# Patient Record
Sex: Female | Born: 1985 | Hispanic: Yes | Marital: Married | Smoking: Never smoker
Health system: Southern US, Community
[De-identification: ages and names within clinical notes are randomized; demographics above are authoritative.]

## PROBLEM LIST (undated history)

## (undated) DIAGNOSIS — E079 Disorder of thyroid, unspecified: Secondary | ICD-10-CM

## (undated) HISTORY — DX: Disorder of thyroid, unspecified: E07.9

---

## 2015-04-17 DIAGNOSIS — Z139 Encounter for screening, unspecified: Secondary | ICD-10-CM

## 2015-04-21 ENCOUNTER — Other Ambulatory Visit: Payer: Self-pay | Admitting: Physician Assistant

## 2015-04-21 ENCOUNTER — Ambulatory Visit: Payer: Self-pay | Admitting: Physician Assistant

## 2015-04-21 ENCOUNTER — Encounter: Payer: Self-pay | Admitting: Physician Assistant

## 2015-04-21 VITALS — BP 136/86 | HR 81 | Temp 97.7°F | Ht 59.5 in | Wt 158.8 lb

## 2015-04-21 DIAGNOSIS — J329 Chronic sinusitis, unspecified: Secondary | ICD-10-CM

## 2015-04-21 DIAGNOSIS — R51 Headache: Secondary | ICD-10-CM

## 2015-04-21 DIAGNOSIS — Z131 Encounter for screening for diabetes mellitus: Secondary | ICD-10-CM

## 2015-04-21 DIAGNOSIS — R519 Headache, unspecified: Secondary | ICD-10-CM

## 2015-04-21 LAB — COMPLETE METABOLIC PANEL WITH GFR
ALBUMIN: 4.4 g/dL (ref 3.6–5.1)
ALT: 151 U/L — ABNORMAL HIGH (ref 6–29)
AST: 60 U/L — AB (ref 10–30)
Alkaline Phosphatase: 115 U/L (ref 33–115)
BILIRUBIN TOTAL: 0.5 mg/dL (ref 0.2–1.2)
BUN: 8 mg/dL (ref 7–25)
CO2: 23 mmol/L (ref 20–31)
Calcium: 9.2 mg/dL (ref 8.6–10.2)
Chloride: 106 mmol/L (ref 98–110)
Creat: 0.53 mg/dL (ref 0.50–1.10)
GFR, Est African American: >89 mL/min (ref >=60)
GLUCOSE: 73 mg/dL (ref 65–99)
Potassium: 4.2 mmol/L (ref 3.5–5.3)
SODIUM: 139 mmol/L (ref 135–146)
TOTAL PROTEIN: 7.6 g/dL (ref 6.1–8.1)

## 2015-04-21 LAB — CBC WITH DIFFERENTIAL/PLATELET
BASOS ABS: 0 10*3/uL (ref 0.0–0.1)
BASOS PCT: 0 % (ref 0–1)
EOS ABS: 0.1 10*3/uL (ref 0.0–0.7)
Eosinophils Relative: 1 % (ref 0–5)
HCT: 37.9 % (ref 36.0–46.0)
HEMOGLOBIN: 12.5 g/dL (ref 12.0–15.0)
Lymphocytes Relative: 32 % (ref 12–46)
Lymphs Abs: 2.7 10*3/uL (ref 0.7–4.0)
MCH: 29.8 pg (ref 26.0–34.0)
MCHC: 33 g/dL (ref 30.0–36.0)
MCV: 90.2 fL (ref 78.0–100.0)
MONOS PCT: 7 % (ref 3–12)
MPV: 8.8 fL (ref 8.6–12.4)
Monocytes Absolute: 0.6 10*3/uL (ref 0.1–1.0)
NEUTROS ABS: 5 10*3/uL (ref 1.7–7.7)
NEUTROS PCT: 60 % (ref 43–77)
PLATELETS: 328 10*3/uL (ref 150–400)
RBC: 4.2 MIL/uL (ref 3.87–5.11)
RDW: 13.8 % (ref 11.5–15.5)
WBC: 8.3 10*3/uL (ref 4.0–10.5)

## 2015-04-21 LAB — TSH: TSH: 5.08 mIU/L — ABNORMAL HIGH

## 2015-04-21 LAB — GLUCOSE, POCT (MANUAL RESULT ENTRY): POC GLUCOSE: 87 mg/dL (ref 70–99)

## 2015-04-21 MED ORDER — AMOXICILLIN 500 MG PO CAPS: 500.0000 mg | ORAL_CAPSULE | Freq: Three times a day (TID) | ORAL | Status: DC

## 2015-04-21 NOTE — Progress Notes (Signed)
BP 136/86 mmHg  Pulse 81  Temp(Src) 97.7 F (36.5 C)  Ht 4' 11.5" (1.511 m)  Wt 158 lb 12.8 oz (72.031 kg)  BMI 31.55 kg/m2  SpO2 99%   Subjective:    Patient ID: Brandi Wilson, female    DOB: Mar 20, 1985, 30 y.o.   MRN: [id number]  HPI: Brandi Wilson is a 30 y.o. female presenting on 04/21/2015 for New Patient (Initial Visit); Headache; and Nasal Congestion   HPI  Chief Complaint  Patient presents with  . New Patient (Initial Visit)  . Headache    R sided HA started 1 year ago  . Nasal Congestion    with R sided Ear pain, began 1 month ago   Pt takes 1 tylenol each day for her HA which  Calms it down but doesn't make it go away She says the pain is more often on the R but sometimes it is on the L.  Radiates to the eye .  When her head hurts, her ear also hurts.   She says she never has a time when she isn't hurting   Pt has been on depo x 5 years.  Relevant past medical, surgical, family and social history reviewed and updated as indicated. Interim medical history since our last visit reviewed. Allergies and medications reviewed and updated.   Current outpatient prescriptions:  .  Acetaminophen (TYLENOL PO), Take 1 tablet by mouth daily., Disp: , Rfl:  .  medroxyPROGESTERone (DEPO-PROVERA) 150 MG/ML injection, Inject 150 mg into the muscle every 3 (three) months., Disp: , Rfl:      Review of Systems  Constitutional: Positive for chills. Negative for fever, diaphoresis, appetite change, fatigue and unexpected weight change.  HENT: Positive for ear pain and sneezing. Negative for congestion, dental problem, drooling, facial swelling, hearing loss, mouth sores, sore throat, trouble swallowing and voice change.   Eyes: Negative for pain, discharge, redness, itching and visual disturbance.  Respiratory: Negative for cough, choking, shortness of breath and wheezing.   Cardiovascular: Negative for chest pain, palpitations and leg swelling.   Gastrointestinal: Positive for abdominal pain and constipation. Negative for vomiting, diarrhea and blood in stool.  Endocrine: Positive for polydipsia. Negative for cold intolerance and heat intolerance.  Genitourinary: Negative for dysuria, hematuria and decreased urine volume.  Musculoskeletal: Negative for back pain, arthralgias and gait problem.  Skin: Negative for rash.  Allergic/Immunologic: Negative for environmental allergies.  Neurological: Positive for light-headedness and headaches. Negative for seizures and syncope.  Hematological: Negative for adenopathy.  Psychiatric/Behavioral: Negative for suicidal ideas, dysphoric mood and agitation. The patient is not nervous/anxious.     Per HPI unless specifically indicated above     Objective:    BP 136/86 mmHg  Pulse 81  Temp(Src) 97.7 F (36.5 C)  Ht 4' 11.5" (1.511 m)  Wt 158 lb 12.8 oz (72.031 kg)  BMI 31.55 kg/m2  SpO2 99%  Wt Readings from Last 3 Encounters:  04/21/15 158 lb 12.8 oz (72.031 kg)    Physical Exam  Constitutional: She is oriented to person, place, and time. She appears well-developed and well-nourished.  HENT:  Head: Normocephalic and atraumatic.  Right Ear: Hearing, tympanic membrane, external ear and ear canal normal.  Left Ear: Hearing, tympanic membrane, external ear and ear canal normal.  Nose: Mucosal edema and rhinorrhea present. No sinus tenderness. Right sinus exhibits frontal sinus tenderness. Right sinus exhibits no maxillary sinus tenderness. Left sinus exhibits no maxillary sinus tenderness and no  frontal sinus tenderness.  Mouth/Throat: Uvula is midline and oropharynx is clear and moist. No oropharyngeal exudate.  Eyes: Conjunctivae and EOM are normal. Pupils are equal, round, and reactive to light.  Neck: Neck supple. No thyromegaly present.  Cardiovascular: Normal rate and regular rhythm.   Pulmonary/Chest: Effort normal and breath sounds normal. She has no wheezes.  Abdominal: Soft.  Bowel sounds are normal. She exhibits no mass. There is no hepatosplenomegaly. There is no tenderness.  Musculoskeletal: She exhibits no edema.  Lymphadenopathy:    She has no cervical adenopathy.  Neurological: She is alert and oriented to person, place, and time. Gait normal.  Skin: Skin is warm and dry.  Psychiatric: She has a normal mood and affect. Her behavior is normal.  Vitals reviewed.   Results for orders placed or performed in visit on 04/21/15  POCT Glucose (CBG)  Result Value Ref Range   POC Glucose 87 70 - 99 mg/dl      Assessment & Plan:    Encounter Diagnoses  Name Primary?  . Sinusitis, unspecified chronicity, unspecified location Yes  . Screening for diabetes mellitus   . Nonintractable headache, unspecified chronicity pattern, unspecified headache type    -Check labs today.  Rx amoxil.  Counseled pt to finish abx until it is gone. -f/u OV 1 mo.  RTO sooner prn new symptoms or worsening

## 2015-04-22 ENCOUNTER — Ambulatory Visit: Payer: Self-pay | Admitting: Physician Assistant

## 2015-04-23 LAB — HEPATITIS PANEL, ACUTE
HCV Ab: NEGATIVE
Hep A IgM: NONREACTIVE
Hep B C IgM: NONREACTIVE
Hepatitis B Surface Ag: NEGATIVE

## 2015-05-22 ENCOUNTER — Ambulatory Visit: Payer: Self-pay | Admitting: Physician Assistant

## 2015-05-22 ENCOUNTER — Encounter: Payer: Self-pay | Admitting: Physician Assistant

## 2015-05-22 VITALS — BP 100/70 | HR 68 | Temp 97.9°F | Ht 59.5 in | Wt 158.0 lb

## 2015-05-22 DIAGNOSIS — E669 Obesity, unspecified: Secondary | ICD-10-CM

## 2015-05-22 DIAGNOSIS — R748 Abnormal levels of other serum enzymes: Secondary | ICD-10-CM | POA: Insufficient documentation

## 2015-05-22 DIAGNOSIS — G8929 Other chronic pain: Secondary | ICD-10-CM

## 2015-05-22 DIAGNOSIS — R51 Headache: Principal | ICD-10-CM

## 2015-05-22 DIAGNOSIS — R519 Headache, unspecified: Secondary | ICD-10-CM | POA: Insufficient documentation

## 2015-05-22 NOTE — Progress Notes (Signed)
BP 100/70 mmHg  Pulse 68  Temp(Src) 97.9 F (36.6 C)  Ht 4' 11.5" (1.511 m)  Wt 158 lb (71.668 kg)  BMI 31.39 kg/m2  SpO2 97%   Subjective:    Patient ID: Brandi Wilson, female    DOB: 1985-05-10, 30 y.o.   MRN: [id number]  HPI: Brandi Wilson is a 30 y.o. female presenting on 05/22/2015 for Headache   HPI Pt says she is taking one tylenol daily.  She says she never takes more than 2/ day.   Pt states she finished her amoxil antibiotic last week.  She says she doesn't feel any different.  HA hurts every day, all day long - R parietal region.  HA started 1 year ago.  She never went to hospital for the HA.  Rates pain 10/10.     Relevant past medical, surgical, family and social history reviewed and updated as indicated. Interim medical history since our last visit reviewed. Allergies and medications reviewed and updated.  Current outpatient prescriptions:  .  Acetaminophen (TYLENOL PO), Take 1 tablet by mouth daily., Disp: , Rfl:  .  medroxyPROGESTERone (DEPO-PROVERA) 150 MG/ML injection, Inject 150 mg into the muscle every 3 (three) months., Disp: , Rfl:   Review of Systems  Constitutional: Positive for chills. Negative for fever, diaphoresis, appetite change, fatigue and unexpected weight change.  HENT: Positive for sneezing. Negative for congestion, dental problem, drooling, ear pain, facial swelling, hearing loss, mouth sores, sore throat, trouble swallowing and voice change.   Eyes: Positive for itching. Negative for pain, discharge, redness and visual disturbance.  Respiratory: Positive for cough. Negative for choking, shortness of breath and wheezing.   Cardiovascular: Negative for chest pain, palpitations and leg swelling.  Gastrointestinal: Negative for vomiting, abdominal pain, diarrhea, constipation and blood in stool.  Endocrine: Positive for polydipsia. Negative for cold intolerance and heat intolerance.  Genitourinary: Negative for  dysuria, hematuria and decreased urine volume.  Musculoskeletal: Positive for back pain and gait problem. Negative for arthralgias.  Skin: Negative for rash.  Allergic/Immunologic: Negative for environmental allergies.  Neurological: Negative for seizures, syncope, light-headedness and headaches.  Hematological: Negative for adenopathy.  Psychiatric/Behavioral: Negative for suicidal ideas, dysphoric mood and agitation. The patient is not nervous/anxious.     Per HPI unless specifically indicated above     Objective:    BP 100/70 mmHg  Pulse 68  Temp(Src) 97.9 F (36.6 C)  Ht 4' 11.5" (1.511 m)  Wt 158 lb (71.668 kg)  BMI 31.39 kg/m2  SpO2 97%  Wt Readings from Last 3 Encounters:  05/22/15 158 lb (71.668 kg)  04/21/15 158 lb 12.8 oz (72.031 kg)    Physical Exam  Constitutional: She is oriented to person, place, and time. She appears well-developed and well-nourished.  HENT:  Head: Normocephalic and atraumatic.  Neck: Neck supple.  Cardiovascular: Normal rate and regular rhythm.   Pulmonary/Chest: Effort normal and breath sounds normal.  Abdominal: Soft. Bowel sounds are normal. She exhibits no mass. There is no hepatosplenomegaly. There is no tenderness.  Musculoskeletal: She exhibits no edema.  Lymphadenopathy:    She has no cervical adenopathy.  Neurological: She is alert and oriented to person, place, and time. She has normal strength. No cranial nerve deficit or sensory deficit. Coordination and gait normal.  Skin: Skin is warm and dry.  Psychiatric: She has a normal mood and affect. Her behavior is normal.  Vitals reviewed.   Results for orders placed or performed in  visit on 04/21/15  CBC w/Diff/Platelet  Result Value Ref Range   WBC 8.3 4.0 - 10.5 K/uL   RBC 4.20 3.87 - 5.11 MIL/uL   Hemoglobin 12.5 12.0 - 15.0 g/dL   HCT 37.9 36.0 - 46.0 %   MCV 90.2 78.0 - 100.0 fL   MCH 29.8 26.0 - 34.0 pg   MCHC 33.0 30.0 - 36.0 g/dL   RDW 13.8 11.5 - 15.5 %   Platelets  328 150 - 400 K/uL   MPV 8.8 8.6 - 12.4 fL   Neutrophils Relative % 60 43 - 77 %   Neutro Abs 5.0 1.7 - 7.7 K/uL   Lymphocytes Relative 32 12 - 46 %   Lymphs Abs 2.7 0.7 - 4.0 K/uL   Monocytes Relative 7 3 - 12 %   Monocytes Absolute 0.6 0.1 - 1.0 K/uL   Eosinophils Relative 1 0 - 5 %   Eosinophils Absolute 0.1 0.0 - 0.7 K/uL   Basophils Relative 0 0 - 1 %   Basophils Absolute 0.0 0.0 - 0.1 K/uL   Smear Review Criteria for review not met   COMPLETE METABOLIC PANEL WITH GFR  Result Value Ref Range   Sodium 139 135 - 146 mmol/L   Potassium 4.2 3.5 - 5.3 mmol/L   Chloride 106 98 - 110 mmol/L   CO2 23 20 - 31 mmol/L   Glucose, Bld 73 65 - 99 mg/dL   BUN 8 7 - 25 mg/dL   Creat 0.53 0.50 - 1.10 mg/dL   Total Bilirubin 0.5 0.2 - 1.2 mg/dL   Alkaline Phosphatase 115 33 - 115 U/L   AST 60 (H) 10 - 30 U/L   ALT 151 (H) 6 - 29 U/L   Total Protein 7.6 6.1 - 8.1 g/dL   Albumin 4.4 3.6 - 5.1 g/dL   Calcium 9.2 8.6 - 10.2 mg/dL   GFR, Est African American >89 >=60 mL/min   GFR, Est Non African American >89 >=60 mL/min  TSH  Result Value Ref Range   TSH 5.08 (H) mIU/L  POCT Glucose (CBG)  Result Value Ref Range   POC Glucose 87 70 - 99 mg/dl      Assessment & Plan:   Encounter Diagnoses  Name Primary?  . Chronic nonintractable headache, unspecified headache type Yes  . Elevated liver enzymes   . Obesity, unspecified     -Reviewed labs with pt -counseled pt to Stop tylenol- take ibu prn -in light of history, will Scan head- [facility] discount application given -f/u 1 month

## 2015-05-26 DIAGNOSIS — E669 Obesity, unspecified: Secondary | ICD-10-CM | POA: Insufficient documentation

## 2015-06-04 ENCOUNTER — Ambulatory Visit (HOSPITAL_COMMUNITY)
Admission: RE | Admit: 2015-06-04 | Discharge: 2015-06-04 | Disposition: A | Discharge status: Home / Self Care | Payer: Self-pay | Source: Ambulatory Visit | Attending: Physician Assistant | Admitting: Physician Assistant

## 2015-06-04 DIAGNOSIS — G930 Cerebral cysts: Secondary | ICD-10-CM | POA: Insufficient documentation

## 2015-06-04 DIAGNOSIS — R51 Headache: Secondary | ICD-10-CM | POA: Insufficient documentation

## 2015-06-23 ENCOUNTER — Ambulatory Visit: Payer: Self-pay | Admitting: Physician Assistant

## 2015-06-30 ENCOUNTER — Ambulatory Visit: Payer: Self-pay | Admitting: Physician Assistant

## 2015-07-02 ENCOUNTER — Encounter: Payer: Self-pay | Admitting: Physician Assistant

## 2015-07-18 NOTE — Congregational Nurse Program (Signed)
Congregational Nurse Program Note  Date of Encounter: 04/17/2015  Past Medical History: No past medical history on file.  Encounter Details:    [facility] Client states she was [facility], but she does not wish to go there any longer due to transportation and scheduling issues per client. She does go to the [facility] for family planning and is due her next injection of Depo on April 17th, 2017. Client is aware that she will need to continue there for her injections. Chief complaints today are Headaches, with history of migraines states they are right-sided and it affects her vision and she reports dizziness. She states she has headaches daily. She denies sinus congestion or recent illness. She has complaint of acid reflux as well as hip pain since she fell in [place] 4 to 5 years ago. Also complains of excessive thirst and excessive hunger along with constipation. Client reports she only drinks 1 glass of water a day ONLY, nothing else and that she eats virtually no fruits or vegetables. Instructed client on the importance of staying hydrated and that drinking water at least 6 to 8 glasses a day is the best way and that will possibly help with constipation. Instructed client that symptoms of dehydration can be headaches and she states understanding . We also discussed the importance of fresh fruits and vegetables in maintaining a healthy diet , but also fiber that can aid in constipation symptoms. Client states understanding. [facility] and appointment made for 04/21/15 at 0945 am. Stressed the importance of continued follow up care even when she feels better as a way to maintain health and to receive preventative care and screenings. Client states understanding. Will follow up as needed. Interview assisted by interpreter Kathaleen BuryMilena Alvarez.

## 2015-07-30 ENCOUNTER — Other Ambulatory Visit (HOSPITAL_COMMUNITY): Payer: Self-pay | Admitting: Nurse Practitioner

## 2015-07-30 DIAGNOSIS — N644 Mastodynia: Secondary | ICD-10-CM

## 2015-08-04 ENCOUNTER — Ambulatory Visit: Payer: Self-pay | Admitting: Physician Assistant

## 2015-08-04 ENCOUNTER — Encounter: Payer: Self-pay | Admitting: Physician Assistant

## 2015-08-04 VITALS — BP 104/66 | HR 65 | Temp 97.7°F | Ht 59.5 in | Wt 157.2 lb

## 2015-08-04 DIAGNOSIS — R748 Abnormal levels of other serum enzymes: Secondary | ICD-10-CM

## 2015-08-04 DIAGNOSIS — R51 Headache: Principal | ICD-10-CM

## 2015-08-04 DIAGNOSIS — R519 Headache, unspecified: Secondary | ICD-10-CM

## 2015-08-04 LAB — HEPATIC FUNCTION PANEL
ALT: 17 U/L (ref 6–29)
AST: 23 U/L (ref 10–30)
Albumin: 4.5 g/dL (ref 3.6–5.1)
Alkaline Phosphatase: 100 U/L (ref 33–115)
BILIRUBIN DIRECT: 0.1 mg/dL (ref <=0.2)
BILIRUBIN INDIRECT: 0.3 mg/dL (ref 0.2–1.2)
BILIRUBIN TOTAL: 0.4 mg/dL (ref 0.2–1.2)
Total Protein: 7.5 g/dL (ref 6.1–8.1)

## 2015-08-04 MED ORDER — PROPRANOLOL HCL 10 MG PO TABS: 10.0000 mg | ORAL_TABLET | Freq: Two times a day (BID) | ORAL | Status: DC

## 2015-08-04 NOTE — Progress Notes (Signed)
BP 104/66 mmHg  Pulse 65  Temp(Src) 97.7 F (36.5 C)  Ht 4' 11.5" (1.511 m)  Wt 157 lb 3.2 oz (71.305 kg)  BMI 31.23 kg/m2  SpO2 99%   Subjective:    Patient ID: Brandi Wilson, female    DOB: Mar 16, 1985, 30 y.o.   MRN: [id number]  HPI: Brandi Hamnna Patricia Diaz de Wilson is a 30 y.o. female presenting on 08/04/2015 for Follow-up   HPI   Pt says she is still having HA.  She states she always has a headache....  All day every day.  She denies stress and depression.    once a month for the last 3 months lasting about half hour, feels sob, dizzy, and sweats. pt thinks it is related to her thyroid  Relevant past medical, surgical, family and social history reviewed and updated as indicated. Interim medical history since our last visit reviewed. Allergies and medications reviewed and updated.   Current outpatient prescriptions:  .  IBUPROFEN PO, Take 1-2 tablets by mouth daily as needed., Disp: , Rfl:  .  medroxyPROGESTERone (DEPO-PROVERA) 150 MG/ML injection, Inject 150 mg into the muscle every 3 (three) months., Disp: , Rfl:  .  Acetaminophen (TYLENOL PO), Take 1 tablet by mouth daily. Reported on 08/04/2015, Disp: , Rfl:    Review of Systems  Constitutional: Positive for appetite change and fatigue. Negative for fever, chills, diaphoresis and unexpected weight change.  HENT: Positive for sneezing. Negative for congestion, dental problem, drooling, ear pain, facial swelling, hearing loss, mouth sores, sore throat, trouble swallowing and voice change.   Eyes: Positive for itching. Negative for pain, discharge, redness and visual disturbance.  Respiratory: Negative for cough, choking, shortness of breath and wheezing.   Cardiovascular: Negative for chest pain, palpitations and leg swelling.  Gastrointestinal: Positive for constipation. Negative for vomiting, abdominal pain, diarrhea and blood in stool.  Endocrine: Negative for cold intolerance, heat intolerance and  polydipsia.  Genitourinary: Negative for dysuria, hematuria and decreased urine volume.  Musculoskeletal: Negative for back pain, arthralgias and gait problem.  Skin: Negative for rash.  Allergic/Immunologic: Negative for environmental allergies.  Neurological: Positive for headaches. Negative for seizures, syncope and light-headedness.  Hematological: Negative for adenopathy.  Psychiatric/Behavioral: Negative for suicidal ideas, dysphoric mood and agitation. The patient is not nervous/anxious.     Per HPI unless specifically indicated above     Objective:    BP 104/66 mmHg  Pulse 65  Temp(Src) 97.7 F (36.5 C)  Ht 4' 11.5" (1.511 m)  Wt 157 lb 3.2 oz (71.305 kg)  BMI 31.23 kg/m2  SpO2 99%  Wt Readings from Last 3 Encounters:  08/04/15 157 lb 3.2 oz (71.305 kg)  05/22/15 158 lb (71.668 kg)  04/21/15 158 lb 12.8 oz (72.031 kg)    Physical Exam  Constitutional: She is oriented to person, place, and time. She appears well-developed and well-nourished.  HENT:  Head: Normocephalic and atraumatic.  Neck: Neck supple.  Cardiovascular: Normal rate and regular rhythm.   Pulmonary/Chest: Effort normal and breath sounds normal.  Abdominal: Soft. Bowel sounds are normal. She exhibits no mass. There is no hepatosplenomegaly. There is no tenderness.  Musculoskeletal: She exhibits no edema.  Lymphadenopathy:    She has no cervical adenopathy.  Neurological: She is alert and oriented to person, place, and time.  Skin: Skin is warm and dry.  Psychiatric: She has a normal mood and affect. Her behavior is normal.  Vitals reviewed.       Assessment &  Plan:   Encounter Diagnoses  Name Primary?  . Chronic nonintractable headache, unspecified headache type Yes  . Elevated liver enzymes     -reviewed CT results with pt -rx low-dose propranolol for HA preventative -recheck LFTs. Pt counseled to avoid APAP -f/u 1 month.  RTO sooner prn

## 2015-08-12 ENCOUNTER — Ambulatory Visit (HOSPITAL_COMMUNITY)
Admission: RE | Admit: 2015-08-12 | Discharge: 2015-08-12 | Disposition: A | Discharge status: Home / Self Care | Payer: Self-pay | Source: Ambulatory Visit | Attending: Nurse Practitioner | Admitting: Nurse Practitioner

## 2015-08-12 DIAGNOSIS — N644 Mastodynia: Secondary | ICD-10-CM

## 2015-09-03 ENCOUNTER — Ambulatory Visit: Payer: Self-pay | Admitting: Physician Assistant

## 2015-09-03 ENCOUNTER — Encounter: Payer: Self-pay | Admitting: Physician Assistant

## 2015-09-03 VITALS — BP 90/64 | HR 66 | Temp 97.9°F | Ht 59.5 in | Wt 159.6 lb

## 2015-09-03 DIAGNOSIS — E669 Obesity, unspecified: Secondary | ICD-10-CM

## 2015-09-03 DIAGNOSIS — G8929 Other chronic pain: Secondary | ICD-10-CM

## 2015-09-03 DIAGNOSIS — R51 Headache: Principal | ICD-10-CM

## 2015-09-03 NOTE — Progress Notes (Signed)
BP 90/64 mmHg  Pulse 66  Temp(Src) 97.9 F (36.6 C)  Ht 4' 11.5" (1.511 m)  Wt 159 lb 9.6 oz (72.394 kg)  BMI 31.71 kg/m2  SpO2 99%   Subjective:    Patient ID: Brandi Wilson, female    DOB: [birth year], 30 y.o.   MRN: [id number]  HPI: Brandi Wilson is a 30 y.o. female presenting on 09/03/2015 for Headache   HPI   States propranolol helping.  HA only comes about once/week now and isn't bad- she doesn't even take otc analgesic for it.  Relevant past medical, surgical, family and social history reviewed and updated as indicated. Interim medical history since our last visit reviewed. Allergies and medications reviewed and updated.   Current outpatient prescriptions:  .  medroxyPROGESTERone (DEPO-PROVERA) 150 MG/ML injection, Inject 150 mg into the muscle every 3 (three) months., Disp: , Rfl:  .  propranolol (INDERAL) 10 MG tablet, Take 1 tablet (10 mg total) by mouth 2 (two) times daily. Tome una tableta por boca dos veces diarias, Disp: 60 tablet, Rfl: 1   Review of Systems  Constitutional: Positive for fatigue. Negative for fever, chills, diaphoresis, appetite change and unexpected weight change.  HENT: Negative for congestion, dental problem, drooling, ear pain, facial swelling, hearing loss, mouth sores, sneezing, sore throat, trouble swallowing and voice change.   Eyes: Positive for itching. Negative for pain, discharge, redness and visual disturbance.  Respiratory: Negative for cough, choking, shortness of breath and wheezing.   Cardiovascular: Negative for chest pain, palpitations and leg swelling.  Gastrointestinal: Positive for constipation. Negative for vomiting, abdominal pain, diarrhea and blood in stool.  Endocrine: Negative for cold intolerance, heat intolerance and polydipsia.  Genitourinary: Negative for dysuria, hematuria and decreased urine volume.  Musculoskeletal: Positive for back pain. Negative for arthralgias and gait  problem.  Skin: Negative for rash.  Allergic/Immunologic: Negative for environmental allergies.  Neurological: Positive for headaches. Negative for seizures, syncope and light-headedness.  Hematological: Negative for adenopathy.  Psychiatric/Behavioral: Negative for suicidal ideas, dysphoric mood and agitation. The patient is not nervous/anxious.     Per HPI unless specifically indicated above     Objective:    BP 90/64 mmHg  Pulse 66  Temp(Src) 97.9 F (36.6 C)  Ht 4' 11.5" (1.511 m)  Wt 159 lb 9.6 oz (72.394 kg)  BMI 31.71 kg/m2  SpO2 99%  Wt Readings from Last 3 Encounters:  09/03/15 159 lb 9.6 oz (72.394 kg)  08/04/15 157 lb 3.2 oz (71.305 kg)  05/22/15 158 lb (71.668 kg)    Physical Exam  Constitutional: She is oriented to person, place, and time. She appears well-developed and well-nourished.  HENT:  Head: Normocephalic and atraumatic.  Eyes: Conjunctivae and EOM are normal. Pupils are equal, round, and reactive to light.  Neck: Neck supple.  Cardiovascular: Normal rate and regular rhythm.   Pulmonary/Chest: Effort normal and breath sounds normal.  Abdominal: Soft. Bowel sounds are normal. She exhibits no mass. There is no hepatosplenomegaly. There is no tenderness.  Musculoskeletal: She exhibits no edema.  Lymphadenopathy:    She has no cervical adenopathy.  Neurological: She is alert and oriented to person, place, and time. No cranial nerve deficit or sensory deficit.  Skin: Skin is warm and dry.  Psychiatric: She has a normal mood and affect. Her behavior is normal.  Vitals reviewed.       Assessment & Plan:   Encounter Diagnoses  Name Primary?  . Chronic nonintractable headache,  unspecified headache type Yes  . Obesity, unspecified     -Continue propranolol for HA prophylaxis -F/u 6 months.  RTO sooner prn

## 2015-10-10 ENCOUNTER — Other Ambulatory Visit: Payer: Self-pay | Admitting: Physician Assistant

## 2015-10-13 ENCOUNTER — Other Ambulatory Visit: Payer: Self-pay | Admitting: Physician Assistant

## 2015-10-13 MED ORDER — PROPRANOLOL HCL 10 MG PO TABS: ORAL_TABLET | ORAL | 6 refills | Status: DC

## 2016-03-03 ENCOUNTER — Ambulatory Visit: Payer: Self-pay | Admitting: Physician Assistant

## 2016-03-11 ENCOUNTER — Encounter: Payer: Self-pay | Admitting: Physician Assistant

## 2016-03-11 ENCOUNTER — Ambulatory Visit: Payer: Self-pay | Admitting: Physician Assistant

## 2016-03-11 VITALS — BP 100/72 | HR 68 | Temp 97.7°F | Ht 59.25 in | Wt 161.0 lb

## 2016-03-11 DIAGNOSIS — Z6832 Body mass index (BMI) 32.0-32.9, adult: Secondary | ICD-10-CM

## 2016-03-11 DIAGNOSIS — E669 Obesity, unspecified: Secondary | ICD-10-CM

## 2016-03-11 DIAGNOSIS — R7989 Other specified abnormal findings of blood chemistry: Secondary | ICD-10-CM

## 2016-03-11 DIAGNOSIS — R748 Abnormal levels of other serum enzymes: Secondary | ICD-10-CM

## 2016-03-11 DIAGNOSIS — G8929 Other chronic pain: Secondary | ICD-10-CM

## 2016-03-11 DIAGNOSIS — Z13220 Encounter for screening for lipoid disorders: Secondary | ICD-10-CM

## 2016-03-11 DIAGNOSIS — R51 Headache: Secondary | ICD-10-CM

## 2016-03-11 NOTE — Progress Notes (Signed)
BP 100/72 (BP Location: Left Arm, Patient Position: Sitting, Cuff Size: Normal)   Pulse 68   Temp 97.7 F (36.5 C) (Other (Comment))   Ht 4' 11.25" (1.505 m)   Wt 161 lb (73 kg)   SpO2 99%   BMI 32.24 kg/m    Subjective:    Patient ID: Brandi SpiesAna Patricia Diaz de Wilson, female    DOB: 06-19-1985, 30 y.o.   MRN: [id number]  HPI: Brandi Spiesna Patricia Diaz de Wilson is a 30 y.o. female presenting on 03/11/2016 for Headache   HPI   The meds are still helping her HA.  She only gets HA maybe once/week.  She takes APAP and it goes away.   Relevant past medical, surgical, family and social history reviewed and updated as indicated. Interim medical history since our last visit reviewed. Allergies and medications reviewed and updated.   Current Outpatient Prescriptions:  .  medroxyPROGESTERone (DEPO-PROVERA) 150 MG/ML injection, Inject 150 mg into the muscle every 3 (three) months., Disp: , Rfl:  .  propranolol (INDERAL) 10 MG tablet, 1 po bid.  Tome una tableta por [employer] dos veces diarias, Disp: 60 tablet, Rfl: 6   Review of Systems  Constitutional: Negative for appetite change, chills, diaphoresis, fatigue, fever and unexpected weight change.  HENT: Positive for dental problem, ear pain and sneezing. Negative for congestion, drooling, facial swelling, hearing loss, mouth sores, sore throat, trouble swallowing and voice change.   Eyes: Positive for itching. Negative for pain, discharge, redness and visual disturbance.  Respiratory: Negative for cough, choking, shortness of breath and wheezing.   Cardiovascular: Negative for chest pain, palpitations and leg swelling.  Gastrointestinal: Negative for abdominal pain, blood in stool, constipation, diarrhea and vomiting.  Endocrine: Negative for cold intolerance, heat intolerance and polydipsia.  Genitourinary: Negative for decreased urine volume, dysuria and hematuria.  Musculoskeletal: Positive for back pain. Negative for arthralgias and gait  problem.  Skin: Negative for rash.  Allergic/Immunologic: Negative for environmental allergies.  Neurological: Positive for headaches. Negative for seizures, syncope and light-headedness.  Hematological: Negative for adenopathy.  Psychiatric/Behavioral: Negative for agitation, dysphoric mood and suicidal ideas. The patient is not nervous/anxious.     Per HPI unless specifically indicated above     Objective:    BP 100/72 (BP Location: Left Arm, Patient Position: Sitting, Cuff Size: Normal)   Pulse 68   Temp 97.7 F (36.5 C) (Other (Comment))   Ht 4' 11.25" (1.505 m)   Wt 161 lb (73 kg)   SpO2 99%   BMI 32.24 kg/m   Wt Readings from Last 3 Encounters:  03/11/16 161 lb (73 kg)  09/03/15 159 lb 9.6 oz (72.4 kg)  08/04/15 157 lb 3.2 oz (71.3 kg)    Physical Exam  Constitutional: She is oriented to person, place, and time. She appears well-developed and well-nourished.  HENT:  Head: Normocephalic and atraumatic.  Neck: Neck supple.  Cardiovascular: Normal rate and regular rhythm.   Pulmonary/Chest: Effort normal and breath sounds normal.  Abdominal: Soft. Bowel sounds are normal. She exhibits no mass. There is no hepatosplenomegaly. There is no tenderness.  Musculoskeletal: She exhibits no edema.  Lymphadenopathy:    She has no cervical adenopathy.  Neurological: She is alert and oriented to person, place, and time.  Skin: Skin is warm and dry.  Psychiatric: She has a normal mood and affect. Her behavior is normal.  Vitals reviewed.      Assessment & Plan:   Encounter Diagnoses  Name Primary?  . Elevated  liver enzymes Yes  . Chronic nonintractable headache, unspecified headache type   . Class 1 obesity with body mass index (BMI) of 32.0 to 32.9 in adult, unspecified obesity type, unspecified whether serious comorbidity present   . Screening cholesterol level   . Abnormal TSH       -check labs.  Will call wti hresults -will continue current Rx -F/u 1 year.  RTO  sooner prn

## 2016-03-23 LAB — COMPREHENSIVE METABOLIC PANEL
ALT: 16 U/L (ref 6–29)
AST: 17 U/L (ref 10–30)
Albumin: 4.2 g/dL (ref 3.6–5.1)
Alkaline Phosphatase: 95 U/L (ref 33–115)
BUN: 8 mg/dL (ref 7–25)
CHLORIDE: 108 mmol/L (ref 98–110)
CO2: 24 mmol/L (ref 20–31)
CREATININE: 0.67 mg/dL (ref 0.50–1.10)
Calcium: 9.2 mg/dL (ref 8.6–10.2)
GLUCOSE: 88 mg/dL (ref 65–99)
POTASSIUM: 4.1 mmol/L (ref 3.5–5.3)
SODIUM: 140 mmol/L (ref 135–146)
TOTAL PROTEIN: 7.4 g/dL (ref 6.1–8.1)
Total Bilirubin: 0.6 mg/dL (ref 0.2–1.2)

## 2016-03-23 LAB — TSH: TSH: 8.03 m[IU]/L — AB

## 2016-03-23 LAB — LIPID PANEL
CHOL/HDL RATIO: 6 ratio — AB (ref <5.0)
CHOLESTEROL: 193 mg/dL (ref <200)
HDL: 32 mg/dL — ABNORMAL LOW (ref >50)
LDL Cholesterol: 125 mg/dL — ABNORMAL HIGH (ref <100)
Triglycerides: 180 mg/dL — ABNORMAL HIGH (ref <150)
VLDL: 36 mg/dL — ABNORMAL HIGH (ref <30)

## 2016-12-07 ENCOUNTER — Other Ambulatory Visit (HOSPITAL_COMMUNITY)
Admission: RE | Admit: 2016-12-07 | Discharge: 2016-12-07 | Disposition: A | Discharge status: Home / Self Care | Payer: Self-pay | Source: Ambulatory Visit | Attending: Physician Assistant | Admitting: Physician Assistant

## 2016-12-07 ENCOUNTER — Ambulatory Visit: Payer: Self-pay | Admitting: Physician Assistant

## 2016-12-07 ENCOUNTER — Encounter: Payer: Self-pay | Admitting: Physician Assistant

## 2016-12-07 VITALS — BP 102/70 | HR 57 | Temp 97.7°F | Ht 59.25 in | Wt 159.2 lb

## 2016-12-07 DIAGNOSIS — R1013 Epigastric pain: Secondary | ICD-10-CM

## 2016-12-07 LAB — AMYLASE: AMYLASE: 67 U/L (ref 28–100)

## 2016-12-07 LAB — COMPREHENSIVE METABOLIC PANEL
ALT: 17 U/L (ref 14–54)
AST: 21 U/L (ref 15–41)
Albumin: 4.4 g/dL (ref 3.5–5.0)
Alkaline Phosphatase: 101 U/L (ref 38–126)
Anion gap: 8 (ref 5–15)
BILIRUBIN TOTAL: 0.6 mg/dL (ref 0.3–1.2)
BUN: 8 mg/dL (ref 6–20)
CALCIUM: 9.2 mg/dL (ref 8.9–10.3)
CHLORIDE: 104 mmol/L (ref 101–111)
CO2: 25 mmol/L (ref 22–32)
CREATININE: 0.54 mg/dL (ref 0.44–1.00)
Glucose, Bld: 92 mg/dL (ref 65–99)
Potassium: 3.9 mmol/L (ref 3.5–5.1)
Sodium: 137 mmol/L (ref 135–145)
TOTAL PROTEIN: 8.3 g/dL — AB (ref 6.5–8.1)

## 2016-12-07 LAB — CBC WITH DIFFERENTIAL/PLATELET
Basophils Absolute: 0 10*3/uL (ref 0.0–0.1)
Basophils Relative: 0 %
EOS PCT: 1 %
Eosinophils Absolute: 0.1 10*3/uL (ref 0.0–0.7)
HCT: 39.3 % (ref 36.0–46.0)
Hemoglobin: 13.4 g/dL (ref 12.0–15.0)
Lymphocytes Relative: 38 %
Lymphs Abs: 2.2 10*3/uL (ref 0.7–4.0)
MCH: 31.2 pg (ref 26.0–34.0)
MCHC: 34.1 g/dL (ref 30.0–36.0)
MCV: 91.6 fL (ref 78.0–100.0)
MONO ABS: 0.5 10*3/uL (ref 0.1–1.0)
MONOS PCT: 10 %
NEUTROS ABS: 2.9 10*3/uL (ref 1.7–7.7)
Neutrophils Relative %: 51 %
PLATELETS: 259 10*3/uL (ref 150–400)
RBC: 4.29 MIL/uL (ref 3.87–5.11)
RDW: 13.1 % (ref 11.5–15.5)
WBC: 5.7 10*3/uL (ref 4.0–10.5)

## 2016-12-07 LAB — LIPASE, BLOOD: LIPASE: 31 U/L (ref 11–51)

## 2016-12-07 MED ORDER — PANTOPRAZOLE SODIUM 40 MG PO TBEC: DELAYED_RELEASE_TABLET | ORAL | 1 refills | Status: DC

## 2016-12-07 NOTE — Progress Notes (Signed)
BP 102/70 (BP Location: Left Arm, Patient Position: Sitting, Cuff Size: Normal)   Pulse (!) 57   Temp 97.7 F (36.5 C)   Ht 4' 11.25" (1.505 m)   Wt 159 lb 4 oz (72.2 kg)   SpO2 99%   BMI 31.89 kg/m    Subjective:    Patient ID: Brandi Wilson, female    DOB: April 06, 1985, 31 y.o.   MRN: [id number]  HPI: Brandi Wilson is a 31 y.o. female presenting on 12/07/2016 for Abdominal Pain   HPI   Pt started with abd pain 15 days ago. It comes and goes.  No diarrhea. No dysuria. Some constipation.  She has BM twice daily and it is normal.    Pt feels like she has a ball in her stomach.  + nausea.   Eating causes worsening pain.   No fever.  No vomiting.    She drinks a lot of coffee  Relevant past medical, surgical, family and social history reviewed and updated as indicated. Interim medical history since our last visit reviewed. Allergies and medications reviewed and updated.   Current Outpatient Prescriptions:  .  medroxyPROGESTERone (DEPO-PROVERA) 150 MG/ML injection, Inject 150 mg into the muscle every 3 (three) months., Disp: , Rfl:  .  propranolol (INDERAL) 10 MG tablet, 1 po bid.  Tome una tableta por boca dos veces diarias, Disp: 60 tablet, Rfl: 6   Review of Systems  Constitutional: Positive for chills and fatigue. Negative for appetite change, diaphoresis, fever and unexpected weight change.  HENT: Negative for congestion, drooling, ear pain, facial swelling, hearing loss, mouth sores, sneezing, sore throat, trouble swallowing and voice change.   Eyes: Positive for pain and itching. Negative for discharge, redness and visual disturbance.  Respiratory: Negative for cough, choking, shortness of breath and wheezing.   Cardiovascular: Positive for palpitations. Negative for chest pain and leg swelling.  Gastrointestinal: Positive for abdominal pain and constipation. Negative for blood in stool, diarrhea and vomiting.  Endocrine: Negative for  cold intolerance, heat intolerance and polydipsia.  Genitourinary: Negative for decreased urine volume, dysuria and hematuria.  Musculoskeletal: Positive for arthralgias, back pain and gait problem.  Skin: Negative for rash.  Allergic/Immunologic: Negative for environmental allergies.  Neurological: Positive for light-headedness and headaches. Negative for seizures and syncope.  Hematological: Negative for adenopathy.  Psychiatric/Behavioral: Negative for agitation, dysphoric mood and suicidal ideas. The patient is nervous/anxious.     Per HPI unless specifically indicated above     Objective:    BP 102/70 (BP Location: Left Arm, Patient Position: Sitting, Cuff Size: Normal)   Pulse (!) 57   Temp 97.7 F (36.5 C)   Ht 4' 11.25" (1.505 m)   Wt 159 lb 4 oz (72.2 kg)   SpO2 99%   BMI 31.89 kg/m   Wt Readings from Last 3 Encounters:  12/07/16 159 lb 4 oz (72.2 kg)  03/11/16 161 lb (73 kg)  09/03/15 159 lb 9.6 oz (72.4 kg)    Physical Exam  Constitutional: She is oriented to person, place, and time. She appears well-developed and well-nourished.  HENT:  Head: Normocephalic and atraumatic.  Neck: Neck supple.  Cardiovascular: Normal rate and regular rhythm.   Pulmonary/Chest: Effort normal and breath sounds normal.  Abdominal: Soft. Bowel sounds are normal. She exhibits no distension, no fluid wave, no ascites, no pulsatile midline mass and no mass. There is no hepatosplenomegaly. There is tenderness in the epigastric area. There is no rigidity, no  rebound, no guarding and no CVA tenderness.  Musculoskeletal: She exhibits no edema.  Lymphadenopathy:    She has no cervical adenopathy.  Neurological: She is alert and oriented to person, place, and time.  Skin: Skin is warm and dry.  Psychiatric: She has a normal mood and affect. Her behavior is normal.  Vitals reviewed.       Assessment & Plan:   Encounter Diagnosis  Name Primary?  . Epigastric pain Yes    -will check  labs -rx protonix -pt counseled to avoid spicy foods and to Avoid coffee.  She is given handout on gerd -pt to follow up 2 wk.  RTO sooner prn worsening or new symptoms

## 2016-12-07 NOTE — Patient Instructions (Signed)
Opciones de alimentos para pacientes con reflujo gastroesofgico - Adultos (Food Choices for Gastroesophageal Reflux Disease, Adult) Cuando se tiene reflujo gastroesofgico (ERGE), los alimentos que se ingieren y los hbitos de alimentacin son muy importantes. Elegir los alimentos adecuados puede ayudar a aliviar las molestias ocasionadas por el ERGE. QU PAUTAS GENERALES DEBO SEGUIR?  Elija las frutas, los vegetales, los cereales integrales, los productos lcteos, la carne de vaca, de pescado y de ave con bajo contenido de grasas.  Limite las grasas, como los aceites, los aderezos para ensalada, la manteca, los frutos secos y el aguacate.  Lleve un registro de las comidas para identificar los alimentos que ocasionan sntomas.  Evite los alimentos que le ocasionen reflujo. Pueden ser distintos para cada persona.  Haga comidas pequeas con frecuencia en lugar de tres comidas abundantes todos los das.  Coma lentamente, en un clima distendido.  Limite el consumo de alimentos fritos.  Cocine los alimentos utilizando mtodos que no sean la fritura.  Evite el consumo alcohol.  Evite beber grandes cantidades de lquidos con las comidas.  Evite agacharse o recostarse hasta despus de 2 o 3horas de haber comido.  QU ALIMENTOS NO SE RECOMIENDAN? Los siguientes son algunos alimentos y bebidas que pueden empeorar los sntomas: Vegetales Tomates. Jugo de tomate. Salsa de tomate y espagueti. Ajes. Cebolla y ajo. Rbano picante. Frutas Naranjas, pomelos y limn (fruta y jugo). Carnes Carnes de vaca, de pescado y de ave con gran contenido de grasas. Esto incluye los perros calientes, las costillas, el jamn, la salchicha, el salame y el tocino. Lcteos Leche entera y leche chocolatada. Crema cida. Crema. Mantequilla. Helados. Queso crema. Bebidas Caf y t negro, con o sin cafena Bebidas gaseosas o energizantes. Condimentos Salsa picante. Salsa barbacoa. Dulces/postres Chocolate y  cacao. Rosquillas. Menta y mentol. Grasas y aceites Alimentos con alto contenido de grasas, incluidas las papas fritas. Otros Vinagre. Especias picantes, como la pimienta negra, la pimienta blanca, la pimienta roja, la pimienta de cayena, el curry en polvo, los clavos de olor, el jengibre y el chile en polvo. Los artculos mencionados arriba pueden no ser una lista completa de las bebidas y los alimentos que se deben evitar. Comunquese con el nutricionista para recibir ms informacin. Esta informacin no tiene como fin reemplazar el consejo del mdico. Asegrese de hacerle al mdico cualquier pregunta que tenga. Document Released: 12/09/2004 Document Revised: 03/22/2014 Document Reviewed: 01/03/2013 Elsevier Interactive Patient Education  2017 Elsevier Inc.  

## 2016-12-08 LAB — H. PYLORI ANTIBODY, IGG: H Pylori IgG: 3.1 Index Value — ABNORMAL HIGH (ref 0.00–0.79)

## 2016-12-09 ENCOUNTER — Other Ambulatory Visit: Payer: Self-pay | Admitting: Physician Assistant

## 2016-12-09 MED ORDER — PROPRANOLOL HCL 10 MG PO TABS: ORAL_TABLET | ORAL | 3 refills | Status: DC

## 2016-12-27 ENCOUNTER — Ambulatory Visit: Payer: Self-pay | Admitting: Physician Assistant

## 2016-12-27 ENCOUNTER — Encounter: Payer: Self-pay | Admitting: Physician Assistant

## 2016-12-27 VITALS — BP 98/60 | HR 79 | Temp 97.9°F | Ht 59.25 in | Wt 160.8 lb

## 2016-12-27 DIAGNOSIS — R1013 Epigastric pain: Secondary | ICD-10-CM

## 2016-12-27 DIAGNOSIS — A048 Other specified bacterial intestinal infections: Secondary | ICD-10-CM

## 2016-12-27 NOTE — Progress Notes (Signed)
BP 98/60 (BP Location: Left Arm, Patient Position: Sitting, Cuff Size: Normal)   Pulse 79   Temp 97.9 F (36.6 C)   Ht 4' 11.25" (1.505 m)   Wt 160 lb 12 oz (72.9 kg)   SpO2 99%   BMI 32.19 kg/m    Subjective:    Patient ID: Brandi Wilson, female    DOB: 01/05/1986, 31 y.o.   MRN: [id number]  HPI: Brandi Wilson is a 31 y.o. female presenting on 12/27/2016 for Follow-up and Abdominal Pain   HPI   Pt states no better. She is avoiding coffeee and spicy foods. She is taking her protonix.   Relevant past medical, surgical, family and social history reviewed and updated as indicated. Interim medical history since our last visit reviewed. Allergies and medications reviewed and updated.   Current Outpatient Prescriptions:  .  medroxyPROGESTERone (DEPO-PROVERA) 150 MG/ML injection, Inject 150 mg into the muscle every 3 (three) months., Disp: , Rfl:  .  pantoprazole (PROTONIX) 40 MG tablet, 1 po qd.  Tome una tableta por boca diaria, Disp: 30 tablet, Rfl: 1 .  propranolol (INDERAL) 10 MG tablet, 1 po bid.  Tome una tableta por boca dos veces diarias (Patient not taking: Reported on 12/27/2016), Disp: 60 tablet, Rfl: 3   Review of Systems  Constitutional: Negative for appetite change, chills, diaphoresis, fatigue, fever and unexpected weight change.  HENT: Negative for congestion, dental problem, drooling, ear pain, facial swelling, hearing loss, mouth sores, sneezing, sore throat, trouble swallowing and voice change.   Eyes: Positive for itching. Negative for pain, discharge, redness and visual disturbance.  Respiratory: Negative for cough, choking, shortness of breath and wheezing.   Cardiovascular: Negative for chest pain, palpitations and leg swelling.  Gastrointestinal: Positive for abdominal pain. Negative for blood in stool, constipation, diarrhea and vomiting.  Endocrine: Negative for cold intolerance, heat intolerance and polydipsia.   Genitourinary: Negative for decreased urine volume, dysuria and hematuria.  Musculoskeletal: Positive for back pain. Negative for arthralgias and gait problem.  Skin: Negative for rash.  Allergic/Immunologic: Negative for environmental allergies.  Neurological: Positive for headaches. Negative for seizures, syncope and light-headedness.  Hematological: Negative for adenopathy.  Psychiatric/Behavioral: Negative for agitation, dysphoric mood and suicidal ideas. The patient is not nervous/anxious.     Per HPI unless specifically indicated above     Objective:    BP 98/60 (BP Location: Left Arm, Patient Position: Sitting, Cuff Size: Normal)   Pulse 79   Temp 97.9 F (36.6 C)   Ht 4' 11.25" (1.505 m)   Wt 160 lb 12 oz (72.9 kg)   SpO2 99%   BMI 32.19 kg/m   Wt Readings from Last 3 Encounters:  12/27/16 160 lb 12 oz (72.9 kg)  12/07/16 159 lb 4 oz (72.2 kg)  03/11/16 161 lb (73 kg)    Physical Exam  Constitutional: She is oriented to person, place, and time. She appears well-developed and well-nourished.  HENT:  Head: Normocephalic and atraumatic.  Neck: Neck supple.  Cardiovascular: Normal rate and regular rhythm.   Pulmonary/Chest: Effort normal and breath sounds normal.  Abdominal: Soft. Bowel sounds are normal. She exhibits no distension, no ascites and no mass. There is no hepatosplenomegaly. There is tenderness in the epigastric area. There is no rigidity, no rebound and no guarding.  Musculoskeletal: She exhibits no edema.  Lymphadenopathy:    She has no cervical adenopathy.  Neurological: She is alert and oriented to person, place, and time.  Skin: Skin is warm and dry.  Psychiatric: She has a normal mood and affect. Her behavior is normal.  Vitals reviewed.   Results for orders placed or performed during the hospital encounter of 12/07/16  CBC w/Diff/Platelet  Result Value Ref Range   WBC 5.7 4.0 - 10.5 K/uL   RBC 4.29 3.87 - 5.11 MIL/uL   Hemoglobin 13.4 12.0 -  15.0 g/dL   HCT 32.3 55.7 - 32.2 %   MCV 91.6 78.0 - 100.0 fL   MCH 31.2 26.0 - 34.0 pg   MCHC 34.1 30.0 - 36.0 g/dL   RDW 02.5 42.7 - 06.2 %   Platelets 259 150 - 400 K/uL   Neutrophils Relative % 51 %   Neutro Abs 2.9 1.7 - 7.7 K/uL   Lymphocytes Relative 38 %   Lymphs Abs 2.2 0.7 - 4.0 K/uL   Monocytes Relative 10 %   Monocytes Absolute 0.5 0.1 - 1.0 K/uL   Eosinophils Relative 1 %   Eosinophils Absolute 0.1 0.0 - 0.7 K/uL   Basophils Relative 0 %   Basophils Absolute 0.0 0.0 - 0.1 K/uL  H. pylori antibody, IgG  Result Value Ref Range   H Pylori IgG 3.10 (H) 0.00 - 0.79 Index Value  Amylase  Result Value Ref Range   Amylase 67 28 - 100 U/L  Lipase, blood  Result Value Ref Range   Lipase 31 11 - 51 U/L  Comprehensive metabolic panel  Result Value Ref Range   Sodium 137 135 - 145 mmol/L   Potassium 3.9 3.5 - 5.1 mmol/L   Chloride 104 101 - 111 mmol/L   CO2 25 22 - 32 mmol/L   Glucose, Bld 92 65 - 99 mg/dL   BUN 8 6 - 20 mg/dL   Creatinine, Ser 3.76 0.44 - 1.00 mg/dL   Calcium 9.2 8.9 - 28.3 mg/dL   Total Protein 8.3 (H) 6.5 - 8.1 g/dL   Albumin 4.4 3.5 - 5.0 g/dL   AST 21 15 - 41 U/L   ALT 17 14 - 54 U/L   Alkaline Phosphatase 101 38 - 126 U/L   Total Bilirubin 0.6 0.3 - 1.2 mg/dL   GFR calc non Af Amer >60 >60 mL/min   GFR calc Af Amer >60 >60 mL/min   Anion gap 8 5 - 15      Assessment & Plan:   Encounter Diagnoses  Name Primary?  . H. pylori infection Yes  . Epigastric pain     -reviewed labs with pt -will order pylera through patient assistance program -pt to Continue protonix until pylera arrives -pt to follow up 2 months.  RTO sooner prn worsening or new symptoms

## 2016-12-27 NOTE — Patient Instructions (Signed)
Infeccin por Helicobacter Pylori (Helicobacter Pylori Infection) La infeccin por Helicobacter pylori es una infeccin en el estmago que es causada por la bacteria Helicobacter pylori (H. pylori). Este tipo de bacteria vive frecuentemente en el revestimiento del estmago. La infeccin puede causar lceras e irritacin (gastritis) en algunas personas. Es la causa ms comn de lceras en el estmago (lcera gstrica) y en la parte superior del intestino (lcera duodenal). Tener esta infeccin tambin puede aumentar el riesgo de cncer de estmago y un tipo de cncer de los glbulos blancos (linfoma) que afecta al estmago. CAUSAS H. pylori es un tipo de bacteria que se encuentra frecuentemente en el estmago de las personas saludables. La bacteria puede pasar de una persona a otra por contacto a travs de las heces o la saliva. No se sabe por qu algunas personas desarrollan lceras, gastritis o cncer a partir de la infeccin. FACTORES DE RIESGO Es ms probable que esta afeccin se manifieste en las personas que:  Tienen familiares con esta infeccin.  Viven con muchas otras personas; por ejemplo, en un dormitorio.  Son de origen africano, hispano o asitico. SNTOMAS La mayora de las personas con esta infeccin no tienen sntomas. Si tiene sntomas, estos pueden incluir los siguientes:  Acidez estomacal.  Dolor de estmago.  Nuseas.  Vmitos.  Sangre en el vmito.  Prdida del apetito.  Mal aliento. DIAGNSTICO Esta afeccin se puede diagnosticar en funcin de los sntomas, un examen fsico y diferentes pruebas. Podrn solicitarle otros estudios, por ejemplo:  Anlisis de sangre o pruebas de materia fecal para verificar las protenas (anticuerpos) que el cuerpo puede producir en respuesta a las bacterias. Estas pruebas son la mejor manera de confirmar el diagnstico.  Una prueba de aliento para verificar el tipo de gas que la bacteria H. pylori libera despus de descomponer una  sustancia llamada urea. Para la prueba, se le pide que beba urea. Esta prueba se realiza frecuentemente despus del tratamiento para saber si el tratamiento funcion.  Un procedimiento en el que un tubo delgado y flexible con una pequea cmara en el extremo se coloca en el estmago y el intestino superior (endoscopia superior). El mdico tambin puede tomar muestras de tejido (biopsia) para realizar pruebas de H. pylori y cncer. TRATAMIENTO El tratamiento para esta afeccin generalmente implica tomar una combinacin de medicamentos (terapia triple) durante un par de semanas. La terapia triple incluye un medicamento para reducir el cido en el estmago y dos tipos de antibiticos. Se aprobaron muchas combinaciones de frmacos para el tratamiento. El tratamiento generalmente mata a la H. pylori y reduce el riesgo de cncer. Despus del tratamiento, es posible que deba volver a realizarse una prueba de H. pylori. En algunos casos, es posible que sea necesario repetir el tratamiento. INSTRUCCIONES PARA EL CUIDADO EN EL HOGAR  Tome los medicamentos de venta libre y los recetados solamente como se lo haya indicado el mdico.  Tome los antibiticos como se lo haya indicado el mdico. No deje de tomar los antibiticos aunque comience a sentirse mejor.  Puede retomar todas sus actividades habituales y continuar su dieta habitual.  Tome estas medidas para evitar infecciones futuras: ? Lvese las manos con frecuencia. ? Asegrese de que los alimentos que consume se hayan preparado adecuadamente. ? Beba agua solamente de fuentes limpias.  Concurra a todas las visitas de control como se lo haya indicado el mdico. Esto es importante. SOLICITE ATENCIN MDICA SI:  Los sntomas no mejoran.  Los sntomas regresan despus del tratamiento. Esta   informacin no tiene como fin reemplazar el consejo del mdico. Asegrese de hacerle al mdico cualquier pregunta que tenga. Document Released: 06/23/2015 Document  Revised: 06/23/2015 Document Reviewed: 03/13/2014 Elsevier Interactive Patient Education  2018 Elsevier Inc.  

## 2017-01-03 ENCOUNTER — Other Ambulatory Visit: Payer: Self-pay | Admitting: Physician Assistant

## 2017-01-03 MED ORDER — BIS SUBCIT-METRONID-TETRACYC 140-125-125 MG PO CAPS: 3.0000 | ORAL_CAPSULE | Freq: Three times a day (TID) | ORAL | 0 refills | Status: DC

## 2017-02-01 ENCOUNTER — Ambulatory Visit: Payer: Self-pay | Admitting: Physician Assistant

## 2017-03-01 ENCOUNTER — Ambulatory Visit: Payer: Self-pay | Admitting: Physician Assistant

## 2017-03-01 ENCOUNTER — Encounter: Payer: Self-pay | Admitting: Physician Assistant

## 2017-03-01 VITALS — BP 100/76 | HR 82 | Temp 97.9°F | Ht 59.35 in | Wt 162.0 lb

## 2017-03-01 DIAGNOSIS — E669 Obesity, unspecified: Secondary | ICD-10-CM

## 2017-03-01 DIAGNOSIS — N3090 Cystitis, unspecified without hematuria: Secondary | ICD-10-CM

## 2017-03-01 DIAGNOSIS — R300 Dysuria: Secondary | ICD-10-CM

## 2017-03-01 DIAGNOSIS — A048 Other specified bacterial intestinal infections: Secondary | ICD-10-CM

## 2017-03-01 DIAGNOSIS — E66811 Obesity, class 1: Secondary | ICD-10-CM

## 2017-03-01 DIAGNOSIS — Z6832 Body mass index (BMI) 32.0-32.9, adult: Secondary | ICD-10-CM

## 2017-03-01 LAB — POCT URINALYSIS DIPSTICK
Bilirubin, UA: NEGATIVE
Glucose, UA: NEGATIVE
Ketones, UA: NEGATIVE
NITRITE UA: POSITIVE
PH UA: 6.5 (ref 5.0–8.0)
Spec Grav, UA: <=1.005 — AB (ref 1.010–1.025)
Urobilinogen, UA: 0.2 E.U./dL

## 2017-03-01 MED ORDER — SULFAMETHOXAZOLE-TRIMETHOPRIM 800-160 MG PO TABS: ORAL_TABLET | ORAL | 0 refills | Status: DC

## 2017-03-01 NOTE — Patient Instructions (Signed)
Infección de las vías urinarias en los adultos  (Urinary Tract Infection, Adult)  Una infección urinaria (IU) es una infección en cualquier parte de las vías urinarias, que incluyen los riñones, los uréteres, la vejiga y la uretra. Estos órganos fabrican, almacenan y eliminan la orina del organismo. La IU puede ser una infección de la vejiga (cistitis) o infección de los riñones (pielonefritis).  CAUSAS  Esta infección puede ser causada por hongos, virus o bacterias. Las bacterias son las causas más comunes de las IU. Esta afección también puede ser provocada por no vaciar la vejiga por completo durante la micción en repetidas ocasiones.  FACTORES DE RIESGO  Es más probable que esta afección se manifieste si:  · Usted ignora la necesidad de orinar o retiene la orina durante largos períodos.  · No vacía la vejiga completamente durante la micción.  · Se limpia de atrás hacia adelante después de orinar o defecar, en el caso de que sea mujer.  · Está circuncidado, en el caso de que sea varón.  · Tiene estreñimiento.  · Tiene colocada una sonda urinaria permanente.  · Tiene debilitado el sistema de defensa (inmunitario) del cuerpo.  · Tiene una enfermedad que afecta los intestinos, los riñones o la vejiga.  · Tiene diabetes.  · Toma antibióticos con frecuencia o durante largos períodos, y los antibióticos ya no resultan eficaces para combatir algunos tipos de infecciones (resistencia a los antibióticos).  · Toma medicamentos que le irritan las vías urinarias.  · Está expuesto a sustancias químicas que le irritan las vías urinarias.  · Es mujer.  SÍNTOMAS  Los síntomas de esta afección incluyen lo siguiente:  · Fiebre.  · Micción frecuente o eliminación de pequeñas cantidades de orina con frecuencia.  · Necesidad urgente de orinar.  · Ardor o dolor al orinar.  · Orina con mal olor u olor atípico.  · Orina turbia.  · Dolor en la parte baja del abdomen o en la espalda.  · Dificultad para orinar.  · Sangre en la  orina.  · Vómitos o más apetito de lo normal.  · Diarrea o dolor abdominal.  · Secreción vaginal, si es mujer.  DIAGNÓSTICO  Esta afección se diagnostica mediante sus antecedentes médicos y un examen físico. También deberá proporcionar una muestra de orina para realizar análisis. Podrán indicarle otros estudios, por ejemplo:  · Análisis de sangre.  · Pruebas de detección de enfermedades de transmisión sexual (ETS).  Si ha tenido más de una IU, se pueden hacer estudios de diagnóstico por imágenes o una citoscopia para determinar la causa de las infecciones.  TRATAMIENTO  El tratamiento de esta afección suele incluir una combinación de dos o más de los siguientes:  · Antibióticos.  · Otros medicamentos para tratar las causas menos frecuentes de infección urinaria.  · Medicamentos de venta libre para aliviar el dolor.  · Consumo de la cantidad necesaria de agua para mantenerse hidratado.  INSTRUCCIONES PARA EL CUIDADO EN EL HOGAR  · Tome los medicamentos de venta libre y los recetados solamente como se lo haya indicado el médico.  · Si le recetaron un antibiótico, tómelo como se lo haya indicado el médico. No deje de tomar los antibióticos aunque comience a sentirse mejor.  · Evite el alcohol, la cafeína, el té y las bebidas gaseosas. Estas sustancias pueden irritar la vejiga.  · Beba suficiente líquido para mantener la orina clara o de color amarillo pálido.  · Concurra a todas las visitas de control como se   lo haya indicado el médico. Esto es importante.  · Asegúrese de lo siguiente:  ? Vaciar la vejiga con frecuencia y en su totalidad. No contener la orina durante largos períodos.  ? Vaciar la vejiga antes y después de tener relaciones sexuales.  ? Limpiar de adelante hacia atrás después de defecar, si es mujer. Usar cada trozo de papel una vez cuando se limpie.    SOLICITE ATENCIÓN MÉDICA SI:  · Siente dolor en la espalda.  · Tiene fiebre.  · Siente náuseas o vomita.  · Los síntomas no mejoran después de 3 días de  tratamiento.  · Los síntomas desaparecen y luego reaparecen.    SOLICITE ATENCIÓN MÉDICA DE INMEDIATO SI:  · Siente dolor intenso en la espalda o en la zona inferior del abdomen.  · Tiene vómitos y no puede tragar medicamentos ni agua.    Esta información no tiene como fin reemplazar el consejo del médico. Asegúrese de hacerle al médico cualquier pregunta que tenga.  Document Released: 12/09/2004 Document Revised: 06/23/2015 Document Reviewed: 01/20/2015  Elsevier Interactive Patient Education © 2017 [employer]

## 2017-03-01 NOTE — Progress Notes (Signed)
BP 100/76 (BP Location: Left Arm, Patient Position: Sitting, Cuff Size: Normal)   Pulse 82   Temp 97.9 F (36.6 C) (Other (Comment))   Ht 4' 11.35" (1.507 m)   Wt 162 lb (73.5 kg)   SpO2 98%   BMI 32.34 kg/m    Subjective:    Patient ID: Brandi SpiesAna Patricia Diaz de Wilson, female    DOB: May 20, 1985, 31 y.o.   MRN: [id number]  HPI: Brandi Wilson is a 31 y.o. female presenting on 03/01/2017 for Follow-up   HPI   Pt took her pylera.  She says her abdominal pain is gone now.   She is taking advil for urine infection.  She took Azo for 8 days - she took her last one yesterday  Pt states Headache better  Relevant past medical, surgical, family and social history reviewed and updated as indicated. Interim medical history since our last visit reviewed. Allergies and medications reviewed and updated.   Current Outpatient Medications:  .  ibuprofen (ADVIL,MOTRIN) 200 MG tablet, Take 200 mg by mouth every 6 (six) hours as needed., Disp: , Rfl:  .  medroxyPROGESTERone (DEPO-PROVERA) 150 MG/ML injection, Inject 150 mg into the muscle every 3 (three) months., Disp: , Rfl:    Review of Systems  Constitutional: Positive for chills. Negative for appetite change, diaphoresis, fatigue, fever and unexpected weight change.  HENT: Negative for congestion, dental problem, drooling, ear pain, facial swelling, hearing loss, mouth sores, sneezing, sore throat, trouble swallowing and voice change.   Eyes: Positive for itching. Negative for pain, discharge, redness and visual disturbance.  Respiratory: Negative for cough, choking, shortness of breath and wheezing.   Cardiovascular: Negative for chest pain, palpitations and leg swelling.  Gastrointestinal: Negative for abdominal pain, blood in stool, constipation, diarrhea and vomiting.  Endocrine: Negative for cold intolerance, heat intolerance and polydipsia.  Genitourinary: Positive for dysuria. Negative for decreased urine volume and  hematuria.  Musculoskeletal: Positive for back pain. Negative for arthralgias and gait problem.  Skin: Negative for rash.  Allergic/Immunologic: Negative for environmental allergies.  Neurological: Negative for seizures, syncope, light-headedness and headaches.  Hematological: Negative for adenopathy.  Psychiatric/Behavioral: Negative for agitation, dysphoric mood and suicidal ideas. The patient is not nervous/anxious.     Per HPI unless specifically indicated above     Objective:    BP 100/76 (BP Location: Left Arm, Patient Position: Sitting, Cuff Size: Normal)   Pulse 82   Temp 97.9 F (36.6 C) (Other (Comment))   Ht 4' 11.35" (1.507 m)   Wt 162 lb (73.5 kg)   SpO2 98%   BMI 32.34 kg/m   Wt Readings from Last 3 Encounters:  03/01/17 162 lb (73.5 kg)  12/27/16 160 lb 12 oz (72.9 kg)  12/07/16 159 lb 4 oz (72.2 kg)    Physical Exam  Constitutional: She is oriented to person, place, and time. She appears well-developed and well-nourished.  HENT:  Head: Normocephalic and atraumatic.  Neck: Neck supple.  Cardiovascular: Normal rate and regular rhythm.  Pulmonary/Chest: Effort normal and breath sounds normal.  Abdominal: Soft. Bowel sounds are normal. She exhibits no mass. There is no hepatosplenomegaly. There is no tenderness. There is no CVA tenderness.  Musculoskeletal: She exhibits no edema.  Lymphadenopathy:    She has no cervical adenopathy.  Neurological: She is alert and oriented to person, place, and time.  Skin: Skin is warm and dry.  Psychiatric: She has a normal mood and affect. Her behavior is normal.  Vitals  reviewed.       Assessment & Plan:     Encounter Diagnoses  Name Primary?  . Cystitis Yes  . Dysuria   . H. pylori infection   . Class 1 obesity with body mass index (BMI) of 32.0 to 32.9 in adult, unspecified obesity type, unspecified whether serious comorbidity present      -rx septra ds for UTI and gave pt reading information  -pt's h  pylori has been treated and pt states relief -pt to follow up 10 months.  RTO sooner prn if HA or abd pain returns or for other problem

## 2017-05-11 ENCOUNTER — Encounter: Payer: Self-pay | Admitting: Physician Assistant

## 2017-05-11 ENCOUNTER — Ambulatory Visit: Payer: Self-pay | Admitting: Physician Assistant

## 2017-05-11 ENCOUNTER — Other Ambulatory Visit: Payer: Self-pay | Admitting: Physician Assistant

## 2017-05-11 VITALS — BP 114/77 | HR 80 | Temp 97.7°F | Ht 59.25 in | Wt 166.5 lb

## 2017-05-11 DIAGNOSIS — R300 Dysuria: Secondary | ICD-10-CM

## 2017-05-11 DIAGNOSIS — N3090 Cystitis, unspecified without hematuria: Secondary | ICD-10-CM

## 2017-05-11 LAB — POCT URINALYSIS DIPSTICK
Bilirubin, UA: NEGATIVE
Glucose, UA: NEGATIVE
Ketones, UA: NEGATIVE
Nitrite, UA: NEGATIVE
PH UA: 8 (ref 5.0–8.0)
Protein, UA: NEGATIVE
UROBILINOGEN UA: 0.2 U/dL

## 2017-05-11 MED ORDER — SULFAMETHOXAZOLE-TRIMETHOPRIM 800-160 MG PO TABS: ORAL_TABLET | ORAL | 0 refills | Status: DC

## 2017-05-11 NOTE — Progress Notes (Signed)
BP 114/77 (BP Location: Right Arm, Patient Position: Sitting, Cuff Size: Normal)   Pulse 80   Temp 97.7 F (36.5 C)   Ht 4' 11.25" (1.505 m)   Wt 166 lb 8 oz (75.5 kg)   SpO2 99%   BMI 33.35 kg/m    Subjective:    Patient ID: Brandi SpiesAna Patricia Diaz de Wilson, female    DOB: Jan 10, 1986, 32 y.o.   MRN: [id number]  HPI: Brandi Spiesna Patricia Diaz de Wilson is a 32 y.o. female presenting on 05/11/2017 for Dysuria (began Friday. pt c/o burning when urinating, itching, flank pain.)   HPI   Chief Complaint  Patient presents with  . Dysuria    began Friday. pt c/o burning when urinating, itching, flank pain.     Relevant past medical, surgical, family and social history reviewed and updated as indicated. Interim medical history since our last visit reviewed. Allergies and medications reviewed and updated.   Current Outpatient Medications:  .  medroxyPROGESTERone (DEPO-PROVERA) 150 MG/ML injection, Inject 150 mg into the muscle every 3 (three) months., Disp: , Rfl:   Review of Systems  Constitutional: Positive for chills. Negative for appetite change, diaphoresis, fatigue, fever and unexpected weight change.  HENT: Negative for congestion, dental problem, drooling, ear pain, facial swelling, hearing loss, mouth sores, sneezing, sore throat, trouble swallowing and voice change.   Eyes: Positive for itching. Negative for pain, discharge, redness and visual disturbance.  Respiratory: Negative for cough, choking, shortness of breath and wheezing.   Cardiovascular: Negative for chest pain, palpitations and leg swelling.  Gastrointestinal: Positive for abdominal pain. Negative for blood in stool, constipation, diarrhea and vomiting.  Endocrine: Negative for cold intolerance, heat intolerance and polydipsia.  Genitourinary: Negative for decreased urine volume, dysuria and hematuria.  Musculoskeletal: Negative for arthralgias, back pain and gait problem.  Skin: Negative for rash.   Allergic/Immunologic: Negative for environmental allergies.  Neurological: Negative for seizures, syncope, light-headedness and headaches.  Hematological: Negative for adenopathy.  Psychiatric/Behavioral: Negative for agitation, dysphoric mood and suicidal ideas. The patient is not nervous/anxious.     Per HPI unless specifically indicated above     Objective:    BP 114/77 (BP Location: Right Arm, Patient Position: Sitting, Cuff Size: Normal)   Pulse 80   Temp 97.7 F (36.5 C)   Ht 4' 11.25" (1.505 m)   Wt 166 lb 8 oz (75.5 kg)   SpO2 99%   BMI 33.35 kg/m   Wt Readings from Last 3 Encounters:  05/11/17 166 lb 8 oz (75.5 kg)  03/01/17 162 lb (73.5 kg)  12/27/16 160 lb 12 oz (72.9 kg)    Physical Exam  Constitutional: She is oriented to person, place, and time. She appears well-developed and well-nourished.  HENT:  Head: Normocephalic and atraumatic.  Neck: Neck supple.  Cardiovascular: Normal rate and regular rhythm.  Pulmonary/Chest: Effort normal and breath sounds normal.  Abdominal: Soft. Bowel sounds are normal. She exhibits no mass. There is no hepatosplenomegaly. There is no tenderness. There is no CVA tenderness.  Musculoskeletal: She exhibits no edema.  Lymphadenopathy:    She has no cervical adenopathy.  Neurological: She is alert and oriented to person, place, and time.  Skin: Skin is warm and dry.  Psychiatric: She has a normal mood and affect. Her behavior is normal.  Vitals reviewed.      Assessment & Plan:   Encounter Diagnoses  Name Primary?  . Cystitis Yes  . Dysuria      -rx septra ds -  handout of URI -follow up as scheduled.  RTO sooner prn

## 2017-05-11 NOTE — Patient Instructions (Signed)
Infeccin de las vas urinarias, en adultos  Urinary Tract Infection, Adult  Una infeccin de las vas urinarias (IVU) es una infeccin en cualquier parte de las vas urinarias, que incluyen los riones, los urteres, la vejiga y la uretra. Estos rganos fabrican, almacenan y eliminan la orina del organismo. La IVU puede ser una infeccin de la vejiga (cistitis) o una infeccin renal (pielonefritis).  Cules son las causas?  Esta infeccin puede deberse a hongos, virus o bacterias. Las bacterias son la causa ms comunes de las IVU. Esta afeccin tambin puede ser provocada por no vaciar la vejiga por completo durante la miccin en repetidas ocasiones.  Qu incrementa el riesgo?  Es ms probable que esta afeccin se manifieste si:   Usted ignora la necesidad de orinar o retiene la orina durante mucho tiempo.   No vaca la vejiga completamente durante la miccin.   Es una mujer y se limpia de atrs hacia adelante despus de orinar o defecar.   Es un hombre y est circuncidado.   Tiene estreimiento.   Tiene colocado un catter urinario (sonda urinaria) permanente.   Tiene debilitado el sistema de defensa (inmunitario) del cuerpo.   Tiene una enfermedad que afecta los intestinos, los riones o la vejiga.   Tiene diabetes.   Toma antibiticos con frecuencia o durante largos perodos, y los antibiticos ya no resultan eficaces para combatir algunos tipos de infecciones (resistencia a los antibiticos).   Toma medicamentos que le irritan las vas urinarias.   Est expuesto a sustancias qumicas que le irritan las vas urinarias.   Es mujer.    Cules son los signos o los sntomas?  Los sntomas de esta afeccin incluyen lo siguiente:   Fiebre.   Miccin frecuente o eliminacin de pequeas cantidades de orina con frecuencia.   Necesidad urgente de orinar.   Ardor o dolor al orinar.   Orina con mal olor u olor atpico.   Orina turbia.   Dolor en la parte baja del abdomen o en la espalda.   Dificultad  para orinar.   Presencia de sangre en la orina.   Tener vmitos o menos apetito de lo normal.   Diarrea o dolor abdominal.   Secrecin vaginal, si es mujer.    Cmo se diagnostica?  Esta afeccin se diagnostica con base en la historia clnica y un examen fsico. Tambin deber proporcionar una muestra de orina para realizar anlisis. Podrn indicarle otros estudios, por ejemplo:   Anlisis de sangre.   Anlisis de enfermedades de transmisin sexual (ETS).    Si ha tenido ms de una IVU, se pueden hacer estudios de diagnstico por imgenes o una cistoscopia para determinar la causa de las infecciones.  Cmo se trata?  El tratamiento de esta afeccin suele incluir una combinacin de dos o ms de los siguientes:   Antibiticos.   Otros medicamentos para tratar causas menos frecuentes de IVU.   Medicamentos de venta libre para aliviar el dolor.   Cantidad suficiente agua para mantenerse hidratado.    Siga estas indicaciones en su casa:   Tome los medicamentos de venta libre y los recetados solamente como se lo haya indicado el mdico.   Si le recetaron un antibitico, tmelo como se lo haya indicado el mdico. No deje de tomar el antibitico aunque comience a sentirse mejor.   Evite el alcohol, la cafena, el t y las bebidas gaseosas. Estas bebidas pueden irritar la vejiga.   Beba suficiente lquido para mantener la orina clara o de   color amarillo plido.   Concurra a todas las visitas de seguimiento como se lo haya indicado el mdico. Esto es importante.   Asegrese de lo siguiente:  ? Vaciar la vejiga con frecuencia y en su totalidad. No retener la orina durante largos perodos.  ? Vaciar la vejiga antes y despus de tener relaciones sexuales.  ? Limpiarse de adelante hacia atrs despus de defecar, si es mujer. Usar cada trozo de papel higinico solo una vez cuando se limpie.  Comunquese con un mdico si:   Siente dolor en la espalda.   Tiene fiebre.   Siente nuseas o vomita.   Los sntomas  no mejoran despus de 3das de tratamiento.   Los sntomas desaparecen y luego vuelven a aparecer.  Solicite ayuda de inmediato si:   Siente dolor intenso en la espalda o en la zona inferior del abdomen.   Tiene vmitos y no puede tragar los medicamentos ni tomar agua.  Esta informacin no tiene como fin reemplazar el consejo del mdico. Asegrese de hacerle al mdico cualquier pregunta que tenga.  Document Released: 12/09/2004 Document Revised: 06/16/2016 Document Reviewed: 01/20/2015  Elsevier Interactive Patient Education  2018 [employer]

## 2017-05-19 ENCOUNTER — Ambulatory Visit: Payer: Self-pay | Admitting: Physician Assistant

## 2017-05-19 ENCOUNTER — Other Ambulatory Visit (HOSPITAL_COMMUNITY)
Admission: RE | Admit: 2017-05-19 | Discharge: 2017-05-19 | Disposition: A | Discharge status: Home / Self Care | Payer: Self-pay | Source: Other Acute Inpatient Hospital | Attending: Physician Assistant | Admitting: Physician Assistant

## 2017-05-19 ENCOUNTER — Other Ambulatory Visit (HOSPITAL_COMMUNITY): Payer: Self-pay | Admitting: *Deleted

## 2017-05-19 ENCOUNTER — Encounter: Payer: Self-pay | Admitting: Physician Assistant

## 2017-05-19 VITALS — BP 110/76 | HR 86 | Temp 97.9°F | Ht 59.25 in | Wt 165.0 lb

## 2017-05-19 DIAGNOSIS — R300 Dysuria: Secondary | ICD-10-CM

## 2017-05-19 DIAGNOSIS — N6459 Other signs and symptoms in breast: Secondary | ICD-10-CM

## 2017-05-19 LAB — POCT URINALYSIS DIPSTICK
BILIRUBIN UA: NEGATIVE
GLUCOSE UA: NEGATIVE
KETONES UA: NEGATIVE
Nitrite, UA: NEGATIVE
PH UA: 6 (ref 5.0–8.0)
Protein, UA: NEGATIVE
Spec Grav, UA: 1.015 (ref 1.010–1.025)
UROBILINOGEN UA: 0.2 U/dL

## 2017-05-19 NOTE — Progress Notes (Signed)
BP 110/76 (BP Location: Left Arm, Patient Position: Sitting, Cuff Size: Normal)   Pulse 86   Temp 97.9 F (36.6 C)   Ht 4' 11.25" (1.505 m)   Wt 165 lb (74.8 kg)   SpO2 99%   BMI 33.05 kg/m    Subjective:    Patient ID: Brandi Wilson, female    DOB: 06-Jun-1985, 32 y.o.   MRN: [id number]  HPI: Brandi Wilson is a 32 y.o. female presenting on 05/19/2017 for Dysuria   HPI   Pt treated for UTI 05/11/17 with septra ds.  Pt has finished the antibiotic and continue to have dysuria.  She says antibiotic didn't help any.   Relevant past medical, surgical, family and social history reviewed and updated as indicated. Interim medical history since our last visit reviewed. Allergies and medications reviewed and updated.  DURRENT MEDS: Depo-provera  Review of Systems  Constitutional: Positive for chills. Negative for appetite change, diaphoresis, fatigue, fever and unexpected weight change.  HENT: Negative for congestion, drooling, ear pain, facial swelling, hearing loss, mouth sores, sneezing, sore throat, trouble swallowing and voice change.   Eyes: Positive for itching. Negative for pain, discharge, redness and visual disturbance.  Respiratory: Negative for cough, choking, shortness of breath and wheezing.   Cardiovascular: Negative for chest pain, palpitations and leg swelling.  Gastrointestinal: Negative for abdominal pain, blood in stool, constipation, diarrhea and vomiting.  Endocrine: Negative for cold intolerance, heat intolerance and polydipsia.  Genitourinary: Negative for decreased urine volume, dysuria and hematuria.  Musculoskeletal: Negative for arthralgias, back pain and gait problem.  Skin: Negative for rash.  Allergic/Immunologic: Negative for environmental allergies.  Neurological: Negative for seizures, syncope, light-headedness and headaches.  Hematological: Negative for adenopathy.  Psychiatric/Behavioral: Negative for agitation,  dysphoric mood and suicidal ideas. The patient is not nervous/anxious.     Per HPI unless specifically indicated above     Objective:    BP 110/76 (BP Location: Left Arm, Patient Position: Sitting, Cuff Size: Normal)   Pulse 86   Temp 97.9 F (36.6 C)   Ht 4' 11.25" (1.505 m)   Wt 165 lb (74.8 kg)   SpO2 99%   BMI 33.05 kg/m   Wt Readings from Last 3 Encounters:  05/19/17 165 lb (74.8 kg)  05/11/17 166 lb 8 oz (75.5 kg)  03/01/17 162 lb (73.5 kg)    Physical Exam  Constitutional: She is oriented to person, place, and time. She appears well-developed and well-nourished.  HENT:  Head: Normocephalic and atraumatic.  Neck: Neck supple.  Cardiovascular: Normal rate and regular rhythm.  Pulmonary/Chest: Effort normal and breath sounds normal.  Abdominal: Soft. Bowel sounds are normal. She exhibits no mass. There is no hepatosplenomegaly. There is no tenderness.  Musculoskeletal: She exhibits no edema.  Lymphadenopathy:    She has no cervical adenopathy.  Neurological: She is alert and oriented to person, place, and time.  Skin: Skin is warm and dry.  Psychiatric: She has a normal mood and affect. Her behavior is normal.  Vitals reviewed.   Results for orders placed or performed in visit on 05/19/17  POCT Urinalysis Dipstick  Result Value Ref Range   Color, UA yellow    Clarity, UA clear    Glucose, UA n    Bilirubin, UA n    Ketones, UA n    Spec Grav, UA 1.015 1.010 - 1.025   Blood, UA tr-intact    pH, UA 6.0 5.0 - 8.0  Protein, UA n    Urobilinogen, UA 0.2 0.2 or 1.0 E.U./dL   Nitrite, UA n    Leukocytes, UA Trace (A) Negative   Appearance     Odor        Assessment & Plan:    Encounter Diagnosis  Name Primary?  . Dysuria Yes     Will send urine for C&S.  Pt counseled to drink plenty of water, avoid sodas.  She can use OTC azo if desired.  We will call her with test results

## 2017-05-21 LAB — URINE CULTURE

## 2017-05-23 ENCOUNTER — Other Ambulatory Visit (HOSPITAL_COMMUNITY): Payer: Self-pay | Admitting: *Deleted

## 2017-05-23 DIAGNOSIS — N6459 Other signs and symptoms in breast: Secondary | ICD-10-CM

## 2017-05-31 ENCOUNTER — Ambulatory Visit (HOSPITAL_COMMUNITY)
Admission: RE | Admit: 2017-05-31 | Discharge: 2017-05-31 | Disposition: A | Discharge status: Home / Self Care | Payer: PRIVATE HEALTH INSURANCE | Source: Ambulatory Visit | Attending: *Deleted | Admitting: *Deleted

## 2017-05-31 DIAGNOSIS — N644 Mastodynia: Secondary | ICD-10-CM | POA: Insufficient documentation

## 2017-05-31 DIAGNOSIS — N6459 Other signs and symptoms in breast: Secondary | ICD-10-CM

## 2017-12-29 ENCOUNTER — Ambulatory Visit: Payer: Self-pay | Admitting: Physician Assistant

## 2018-01-03 ENCOUNTER — Encounter: Payer: Self-pay | Admitting: Physician Assistant

## 2018-01-25 ENCOUNTER — Ambulatory Visit: Payer: Self-pay | Admitting: Physician Assistant

## 2018-01-25 ENCOUNTER — Encounter: Payer: Self-pay | Admitting: Physician Assistant

## 2018-01-25 VITALS — BP 121/80 | HR 81 | Temp 97.9°F | Ht 59.25 in | Wt 167.0 lb

## 2018-01-25 DIAGNOSIS — R51 Headache: Secondary | ICD-10-CM

## 2018-01-25 DIAGNOSIS — F419 Anxiety disorder, unspecified: Secondary | ICD-10-CM

## 2018-01-25 DIAGNOSIS — R109 Unspecified abdominal pain: Secondary | ICD-10-CM

## 2018-01-25 DIAGNOSIS — E785 Hyperlipidemia, unspecified: Secondary | ICD-10-CM

## 2018-01-25 DIAGNOSIS — E669 Obesity, unspecified: Secondary | ICD-10-CM

## 2018-01-25 DIAGNOSIS — R7989 Other specified abnormal findings of blood chemistry: Secondary | ICD-10-CM

## 2018-01-25 DIAGNOSIS — G8929 Other chronic pain: Secondary | ICD-10-CM

## 2018-01-25 DIAGNOSIS — R748 Abnormal levels of other serum enzymes: Secondary | ICD-10-CM

## 2018-01-25 MED ORDER — PROPRANOLOL HCL 10 MG PO TABS: ORAL_TABLET | ORAL | 6 refills | Status: DC

## 2018-01-25 MED ORDER — ALPRAZOLAM 0.25 MG PO TABS: ORAL_TABLET | ORAL | 0 refills | Status: DC

## 2018-01-25 MED ORDER — OMEPRAZOLE 40 MG PO CPDR: DELAYED_RELEASE_CAPSULE | ORAL | 7 refills | Status: DC

## 2018-01-25 NOTE — Progress Notes (Signed)
BP 121/80 (BP Location: Right Arm, Patient Position: Sitting, Cuff Size: Normal)   Pulse 81   Temp 97.9 F (36.6 C)   Ht 4' 11.25" (1.505 m)   Wt 167 lb (75.8 kg)   SpO2 98%   BMI 33.45 kg/m    Subjective:    Patient ID: Brandi SpiesAna Patricia Diaz de Wilson, female    DOB: 08/05/1985, 32 y.o.   MRN: [id number]  HPI: Brandi Spiesna Patricia Diaz de Wilson is a 32 y.o. female presenting on 01/25/2018 for Follow-up   HPI   Pt here for routine follow up.    Pt says she feels like Sometimes feels like something stuck in her throat.  Pt also complains of RLQ pain for 15 days.   Pt also wants something to help her nerves.   She is flying soon and is nervous about it.  Pt requests to get back on propranaolol for headaches.  She was on it in the past and just stopped it.    Pt is very poor historian.  Bowels regular.  No dysuria.  No fever.    Relevant past medical, surgical, family and social history reviewed and updated as indicated. Interim medical history since our last visit reviewed. Allergies and medications reviewed and updated.   Current Outpatient Medications:  .  medroxyPROGESTERone (DEPO-PROVERA) 150 MG/ML injection, Inject 150 mg into the muscle every 3 (three) months., Disp: , Rfl:    Review of Systems  Constitutional: Negative for appetite change, chills, diaphoresis, fatigue, fever and unexpected weight change.  HENT: Positive for dental problem. Negative for congestion, drooling, ear pain, facial swelling, hearing loss, mouth sores, sneezing, sore throat, trouble swallowing and voice change.   Eyes: Negative for pain, discharge, redness, itching and visual disturbance.  Respiratory: Negative for cough, choking, shortness of breath and wheezing.   Cardiovascular: Positive for chest pain. Negative for palpitations and leg swelling.  Gastrointestinal: Negative for abdominal pain, blood in stool, constipation, diarrhea and vomiting.  Endocrine: Negative for cold intolerance, heat  intolerance and polydipsia.  Genitourinary: Negative for decreased urine volume, dysuria and hematuria.  Musculoskeletal: Negative for arthralgias, back pain and gait problem.  Skin: Negative for rash.  Allergic/Immunologic: Negative for environmental allergies.  Neurological: Positive for headaches. Negative for seizures, syncope and light-headedness.  Hematological: Negative for adenopathy.  Psychiatric/Behavioral: Negative for agitation, dysphoric mood and suicidal ideas. The patient is not nervous/anxious.     Per HPI unless specifically indicated above     Objective:    BP 121/80 (BP Location: Right Arm, Patient Position: Sitting, Cuff Size: Normal)   Pulse 81   Temp 97.9 F (36.6 C)   Ht 4' 11.25" (1.505 m)   Wt 167 lb (75.8 kg)   SpO2 98%   BMI 33.45 kg/m   Wt Readings from Last 3 Encounters:  01/25/18 167 lb (75.8 kg)  05/19/17 165 lb (74.8 kg)  05/11/17 166 lb 8 oz (75.5 kg)    Physical Exam  Constitutional: She is oriented to person, place, and time. She appears well-developed and well-nourished.  HENT:  Head: Normocephalic and atraumatic.  Neck: Neck supple.  Cardiovascular: Normal rate and regular rhythm.  Pulmonary/Chest: Effort normal and breath sounds normal.  Abdominal: Soft. Bowel sounds are normal. She exhibits no mass. There is no hepatosplenomegaly. There is no tenderness.  Musculoskeletal: She exhibits no edema.  Lymphadenopathy:    She has no cervical adenopathy.  Neurological: She is alert and oriented to person, place, and time.  Skin: Skin is  warm and dry.  Psychiatric: She has a normal mood and affect. Her behavior is normal.  Vitals reviewed.       Assessment & Plan:    Encounter Diagnoses  Name Primary?  . Abdominal pain, unspecified abdominal location Yes  . Anxiety   . Elevated liver enzymes   . Abnormal TSH   . Hyperlipidemia, unspecified hyperlipidemia type   . Chronic nonintractable headache, unspecified headache type   .  Obesity, unspecified classification, unspecified obesity type, unspecified whether serious comorbidity present     -will check labs - Lipids cmp, tsh -rx xanax to help her with flying. Pt told she will be given this one rx and no refills -will restart propranolol per pt request -restart omeprazole for abdominal pain and globus sensation -pt to follow up in 6 months.  RTO sooner if pains fail to resolve or if she develops new symptoms

## 2018-01-27 ENCOUNTER — Other Ambulatory Visit (HOSPITAL_COMMUNITY)
Admission: RE | Admit: 2018-01-27 | Discharge: 2018-01-27 | Disposition: A | Discharge status: Home / Self Care | Payer: Self-pay | Source: Ambulatory Visit | Attending: Physician Assistant | Admitting: Physician Assistant

## 2018-01-27 DIAGNOSIS — R69 Illness, unspecified: Secondary | ICD-10-CM | POA: Insufficient documentation

## 2018-01-27 LAB — LIPID PANEL
CHOL/HDL RATIO: 5.6 ratio
Cholesterol: 245 mg/dL — ABNORMAL HIGH (ref 0–200)
HDL: 44 mg/dL (ref >40)
LDL Cholesterol: 161 mg/dL — ABNORMAL HIGH (ref 0–99)
TRIGLYCERIDES: 201 mg/dL — AB (ref <150)
VLDL: 40 mg/dL (ref 0–40)

## 2018-01-27 LAB — COMPREHENSIVE METABOLIC PANEL
ALT: 18 U/L (ref 0–44)
ANION GAP: 8 (ref 5–15)
AST: 20 U/L (ref 15–41)
Albumin: 4.4 g/dL (ref 3.5–5.0)
Alkaline Phosphatase: 100 U/L (ref 38–126)
BUN: 11 mg/dL (ref 6–20)
CO2: 22 mmol/L (ref 22–32)
Calcium: 9.3 mg/dL (ref 8.9–10.3)
Chloride: 107 mmol/L (ref 98–111)
Creatinine, Ser: 0.64 mg/dL (ref 0.44–1.00)
GFR calc Af Amer: >60 mL/min (ref >60)
Glucose, Bld: 90 mg/dL (ref 70–99)
POTASSIUM: 4.2 mmol/L (ref 3.5–5.1)
Sodium: 137 mmol/L (ref 135–145)
TOTAL PROTEIN: 8.6 g/dL — AB (ref 6.5–8.1)
Total Bilirubin: 0.7 mg/dL (ref 0.3–1.2)

## 2018-01-27 LAB — TSH: TSH: 11.84 u[IU]/mL — AB (ref 0.350–4.500)

## 2018-05-24 ENCOUNTER — Ambulatory Visit: Payer: Self-pay | Admitting: Physician Assistant

## 2018-05-30 ENCOUNTER — Ambulatory Visit: Payer: Self-pay | Admitting: Physician Assistant

## 2018-06-05 ENCOUNTER — Other Ambulatory Visit: Payer: Self-pay | Admitting: Physician Assistant

## 2018-06-07 ENCOUNTER — Ambulatory Visit: Payer: Self-pay | Admitting: Physician Assistant

## 2018-06-07 DIAGNOSIS — E785 Hyperlipidemia, unspecified: Secondary | ICD-10-CM

## 2018-06-07 DIAGNOSIS — E039 Hypothyroidism, unspecified: Secondary | ICD-10-CM

## 2018-06-07 MED ORDER — LEVOTHYROXINE SODIUM 50 MCG PO TABS: ORAL_TABLET | ORAL | 4 refills | Status: DC

## 2018-06-07 NOTE — Progress Notes (Signed)
   There were no vitals taken for this visit.   Subjective:    Patient ID: Brandi Wilson, female    DOB: [birth year], 33 y.o.   MRN: [id number]  HPI: Brandi Wilson is a 33 y.o. female presenting on 06/07/2018 for No chief complaint on file.   HPI  This is a telemedicine visit through [employer] due to coronavirus pandemic  Pt says she has been out of her thyroid med for about a while .  She thought [facility].    She says she is not following a lowfat diet  She is feeling well and denies fever, cough, sob.   Relevant past medical, surgical, family and social history reviewed and updated as indicated. Interim medical history since our last visit reviewed. Allergies and medications reviewed and updated.   Current Outpatient Medications:  .  acyclovir (ZOVIRAX) 400 MG tablet, Take 400 mg by mouth 5 (five) times daily., Disp: , Rfl:  .  medroxyPROGESTERone (DEPO-PROVERA) 150 MG/ML injection, Inject 150 mg into the muscle every 3 (three) months., Disp: , Rfl:     Review of Systems  Per HPI unless specifically indicated above     Objective:    There were no vitals taken for this visit.  Wt Readings from Last 3 Encounters:  01/25/18 167 lb (75.8 kg)  05/19/17 165 lb (74.8 kg)  05/11/17 166 lb 8 oz (75.5 kg)    Physical Exam Constitutional:      Appearance: Normal appearance.  HENT:     Head: Normocephalic and atraumatic.  Pulmonary:     Effort: Pulmonary effort is normal. No respiratory distress.  Neurological:     Mental Status: She is alert and oriented to person, place, and time.  Psychiatric:        Mood and Affect: Mood normal.        Behavior: Behavior normal.         Assessment & Plan:    Encounter Diagnoses  Name Primary?  . Hypothyroidism, unspecified type Yes  . Hyperlipidemia, unspecified hyperlipidemia type     -Will call [facility] to check levohtroxine dosage and refill -labs deferred at this time due to  CV19 -Counseled on lowfat diet -pt to follow up 3 months.  RTO sooner prn

## 2018-06-12 ENCOUNTER — Encounter: Payer: Self-pay | Admitting: Physician Assistant

## 2018-07-26 ENCOUNTER — Ambulatory Visit: Payer: Self-pay | Admitting: Physician Assistant

## 2018-08-23 ENCOUNTER — Other Ambulatory Visit: Payer: Self-pay | Admitting: Physician Assistant

## 2018-08-23 DIAGNOSIS — E785 Hyperlipidemia, unspecified: Secondary | ICD-10-CM

## 2018-08-23 DIAGNOSIS — E039 Hypothyroidism, unspecified: Secondary | ICD-10-CM

## 2018-09-05 ENCOUNTER — Encounter: Payer: Self-pay | Admitting: Physician Assistant

## 2018-09-05 ENCOUNTER — Ambulatory Visit: Payer: Self-pay | Admitting: Physician Assistant

## 2018-09-05 DIAGNOSIS — Z789 Other specified health status: Secondary | ICD-10-CM

## 2018-09-05 DIAGNOSIS — E785 Hyperlipidemia, unspecified: Secondary | ICD-10-CM

## 2018-09-05 DIAGNOSIS — E039 Hypothyroidism, unspecified: Secondary | ICD-10-CM

## 2018-09-05 NOTE — Progress Notes (Signed)
   There were no vitals taken for this visit.   Subjective:    Patient ID: Brandi Wilson, female    DOB: 05/21/1985, 33 y.o.   MRN: [id number]  HPI: Niajah Sipos is a 34 y.o. female presenting on 09/05/2018 for No chief complaint on file.   HPI     This is a telemedicine visit through [employer] due to coronavirus pandemic   I connected with  Brandi Wilson on 09/05/18 by a video enabled telemedicine application and verified that I am speaking with the correct person using two identifiers.   I discussed the limitations of evaluation and management by telemedicine. The patient expressed understanding and agreed to proceed.   Pt is at home.  Provider is at office  She is doing well.  She did not get her labs drawn.  She has no complaints today.   Relevant past medical, surgical, family and social history reviewed and updated as indicated. Interim medical history since our last visit reviewed. Allergies and medications reviewed and updated.    Current Outpatient Medications:  .  acyclovir (ZOVIRAX) 400 MG tablet, Take 400 mg by mouth 5 (five) times daily., Disp: , Rfl:  .  levothyroxine (SYNTHROID, LEVOTHROID) 50 MCG tablet, 1 po qd.  Tome una tableta por boca diaria, Disp: 30 tablet, Rfl: 4 .  medroxyPROGESTERone (DEPO-PROVERA) 150 MG/ML injection, Inject 150 mg into the muscle every 3 (three) months., Disp: , Rfl:     Review of Systems  Per HPI unless specifically indicated above     Objective:    There were no vitals taken for this visit.  Wt Readings from Last 3 Encounters:  01/25/18 167 lb (75.8 kg)  05/19/17 165 lb (74.8 kg)  05/11/17 166 lb 8 oz (75.5 kg)    Physical Exam Constitutional:      General: She is not in acute distress.    Appearance: Normal appearance. She is not ill-appearing.  HENT:     Head: Normocephalic and atraumatic.  Pulmonary:     Effort: Pulmonary effort is normal. No respiratory distress.   Neurological:     Mental Status: She is alert and oriented to person, place, and time.  Psychiatric:        Attention and Perception: Attention normal.        Speech: Speech normal.        Behavior: Behavior is cooperative.         Assessment & Plan:    Encounter Diagnoses  Name Primary?  . Hypothyroidism, unspecified type Yes  . Hyperlipidemia, unspecified hyperlipidemia type   . Non-English speaking patient       -Pt to get her labs done.  She will be called with results.  Pt didn't understand why she needed to get her blood drawn again and this was explained to her - Her follow-up appointment will be scheduled when she is called with results -pt to continue with current medications

## 2018-09-07 ENCOUNTER — Other Ambulatory Visit (HOSPITAL_COMMUNITY)
Admission: RE | Admit: 2018-09-07 | Discharge: 2018-09-07 | Disposition: A | Discharge status: Home / Self Care | Payer: Self-pay | Source: Ambulatory Visit | Attending: Physician Assistant | Admitting: Physician Assistant

## 2018-09-07 ENCOUNTER — Other Ambulatory Visit: Payer: Self-pay

## 2018-09-07 DIAGNOSIS — E039 Hypothyroidism, unspecified: Secondary | ICD-10-CM | POA: Insufficient documentation

## 2018-09-07 DIAGNOSIS — E785 Hyperlipidemia, unspecified: Secondary | ICD-10-CM | POA: Insufficient documentation

## 2018-09-07 LAB — COMPREHENSIVE METABOLIC PANEL
ALT: 18 U/L (ref 0–44)
AST: 20 U/L (ref 15–41)
Albumin: 4.2 g/dL (ref 3.5–5.0)
Alkaline Phosphatase: 120 U/L (ref 38–126)
Anion gap: 12 (ref 5–15)
BUN: 9 mg/dL (ref 6–20)
CO2: 22 mmol/L (ref 22–32)
Calcium: 9.3 mg/dL (ref 8.9–10.3)
Chloride: 106 mmol/L (ref 98–111)
Creatinine, Ser: 0.67 mg/dL (ref 0.44–1.00)
GFR calc Af Amer: >60 mL/min (ref >60)
GFR calc non Af Amer: >60 mL/min (ref >60)
Glucose, Bld: 91 mg/dL (ref 70–99)
Potassium: 4.2 mmol/L (ref 3.5–5.1)
Sodium: 140 mmol/L (ref 135–145)
Total Bilirubin: 0.9 mg/dL (ref 0.3–1.2)
Total Protein: 8 g/dL (ref 6.5–8.1)

## 2018-09-07 LAB — LIPID PANEL
Cholesterol: 216 mg/dL — ABNORMAL HIGH (ref 0–200)
HDL: 38 mg/dL — ABNORMAL LOW (ref >40)
LDL Cholesterol: 147 mg/dL — ABNORMAL HIGH (ref 0–99)
Total CHOL/HDL Ratio: 5.7 RATIO
Triglycerides: 154 mg/dL — ABNORMAL HIGH (ref <150)
VLDL: 31 mg/dL (ref 0–40)

## 2018-09-07 LAB — TSH: TSH: 5.212 u[IU]/mL — ABNORMAL HIGH (ref 0.350–4.500)

## 2018-09-26 ENCOUNTER — Encounter: Payer: Self-pay | Admitting: Student

## 2018-09-27 ENCOUNTER — Other Ambulatory Visit: Payer: Self-pay | Admitting: Physician Assistant

## 2018-09-27 DIAGNOSIS — E039 Hypothyroidism, unspecified: Secondary | ICD-10-CM

## 2018-09-27 DIAGNOSIS — E785 Hyperlipidemia, unspecified: Secondary | ICD-10-CM

## 2018-09-27 NOTE — Progress Notes (Unsigned)
t

## 2018-10-05 ENCOUNTER — Other Ambulatory Visit: Payer: Self-pay | Admitting: Physician Assistant

## 2018-10-05 ENCOUNTER — Telehealth: Payer: Self-pay | Admitting: Student

## 2018-10-05 MED ORDER — SULFAMETHOXAZOLE-TRIMETHOPRIM 800-160 MG PO TABS: ORAL_TABLET | ORAL | 0 refills | Status: DC

## 2018-10-05 NOTE — Telephone Encounter (Signed)
Pt called c/o low abd pain and dysuria that started 3 days ago (10-02-18). Pt states she has tried alka-seltzer and advil but have not been helpful. Pt is requesting antibiotic sent to [facility] in [place].  PA agrees to send rx septra to [facility] in [place] and has e-scribed it on 10-05-18. LPN notified pt and pt verbalized understanding.

## 2018-10-09 ENCOUNTER — Other Ambulatory Visit: Payer: Self-pay | Admitting: Physician Assistant

## 2018-11-01 ENCOUNTER — Other Ambulatory Visit (HOSPITAL_COMMUNITY): Payer: Self-pay | Admitting: Nurse Practitioner

## 2018-11-01 ENCOUNTER — Other Ambulatory Visit: Payer: Self-pay | Admitting: Nurse Practitioner

## 2018-11-01 DIAGNOSIS — R102 Pelvic and perineal pain: Secondary | ICD-10-CM

## 2018-11-07 ENCOUNTER — Other Ambulatory Visit: Payer: Self-pay

## 2018-11-07 ENCOUNTER — Ambulatory Visit (HOSPITAL_COMMUNITY)
Admission: RE | Admit: 2018-11-07 | Discharge: 2018-11-07 | Disposition: A | Discharge status: Home / Self Care | Payer: Self-pay | Source: Ambulatory Visit | Attending: Nurse Practitioner | Admitting: Nurse Practitioner

## 2018-11-07 ENCOUNTER — Encounter (HOSPITAL_COMMUNITY): Payer: Self-pay

## 2018-11-07 DIAGNOSIS — R102 Pelvic and perineal pain: Secondary | ICD-10-CM | POA: Insufficient documentation

## 2018-11-15 ENCOUNTER — Other Ambulatory Visit: Payer: Self-pay | Admitting: Physician Assistant

## 2018-11-16 ENCOUNTER — Other Ambulatory Visit: Payer: Self-pay | Admitting: Physician Assistant

## 2018-11-16 DIAGNOSIS — R300 Dysuria: Secondary | ICD-10-CM

## 2018-11-16 DIAGNOSIS — R109 Unspecified abdominal pain: Secondary | ICD-10-CM

## 2018-11-22 ENCOUNTER — Ambulatory Visit: Payer: Self-pay | Admitting: Physician Assistant

## 2018-11-22 ENCOUNTER — Encounter: Payer: Self-pay | Admitting: Physician Assistant

## 2018-11-22 DIAGNOSIS — Z789 Other specified health status: Secondary | ICD-10-CM

## 2018-11-22 DIAGNOSIS — G8929 Other chronic pain: Secondary | ICD-10-CM

## 2018-11-22 DIAGNOSIS — R1031 Right lower quadrant pain: Secondary | ICD-10-CM

## 2018-11-22 NOTE — Progress Notes (Signed)
There were no vitals taken for this visit.   Subjective:    Patient ID: Brandi SpiesAna Patricia Diaz de Wilson, female    DOB: 09/14/1985, [age] y.o.   MRN: [id number]  HPI: Brandi Wilson is a 33 y.o. female presenting on 11/22/2018 for Abdominal Pain   HPI   This is a telemedicine appointment through [employer] due to coronavirus pandemic  I connected with  Brandi Wilson on 11/22/18 by a video enabled telemedicine application and verified that I am speaking with the correct person using two identifiers.   I discussed the limitations of evaluation and management by telemedicine. The patient expressed understanding and agreed to proceed.  Pt is at home.  Provider is at office.    Pt called with dysuria and was supposed to supple urine sample to the lab and rx was sent to pharmacy.  Pt never supplied sample.    (had to send rx before getting ua result due to translator was off/out of the office).     Pt still complains of Lower abdominal pain on the right side.  It started 8 months ago.   She says it is contstant.  She didn't mention this at her June appointment.    LMP April 2011.  She has been on depo-provera since that time.    She is still having pain with urination. (she completed the antibiotics)  She says her BMs are normal.  She does have a history of constipation.  No problems eating.  She is not having any nausea or vomiting.      When we mentioned that she would need pelvic US she says that she has had one in the past and review of imaging tab revealed US done.    It appears to have been ordered by provider that gives her depo-provera.  Pt was very difficult historian today    Relevant past medical, surgical, family and social history reviewed and updated as indicated. Interim medical history since our last visit reviewed. Allergies and medications reviewed and updated.   Current Outpatient Medications:  .  acetaminophen (TYLENOL) 500 MG tablet,  Take 500 mg by mouth every 6 (six) hours as needed., Disp: , Rfl:  .  acyclovir (ZOVIRAX) 400 MG tablet, Take 400 mg by mouth 5 (five) times daily., Disp: , Rfl:  .  EUTHYROX 50 MCG tablet, Take 1 tablet by mouth once daily, Disp: 90 tablet, Rfl: 0 .  medroxyPROGESTERone (DEPO-PROVERA) 150 MG/ML injection, Inject 150 mg into the muscle every 3 (three) months., Disp: , Rfl:  .  propranolol (INDERAL) 10 MG tablet, Take 1 tablet by mouth twice daily, Disp: 60 tablet, Rfl: 0   Review of Systems  Per HPI unless specifically indicated above     Objective:    There were no vitals taken for this visit.  Wt Readings from Last 3 Encounters:  01/25/18 167 lb (75.8 kg)  05/19/17 165 lb (74.8 kg)  05/11/17 166 lb 8 oz (75.5 kg)    Physical Exam Constitutional:      General: She is not in acute distress.    Appearance: She is not ill-appearing.  HENT:     Head: Normocephalic and atraumatic.  Pulmonary:     Effort: Pulmonary effort is normal. No respiratory distress.  Neurological:     Mental Status: She is alert and oriented to person, place, and time.  Psychiatric:        Attention and Perception: Attention normal.  Speech: Speech normal.        Behavior: Behavior is cooperative.           Assessment & Plan:    Encounter Diagnoses  Name Primary?  . Chronic RLQ pain Yes  . Non-English speaking patient       -Pelvic US done 11/07/18 was normal -will obtain UA -She recently was mailed in  Application for [employer] financial assistance.  She is to get that submitted   -Explained to pt to only go to 1 PCP and she says she will come here.  -Will order ct as [place] normal   -pt to follow up after CT.  She is to contact office sooner prn

## 2018-11-23 ENCOUNTER — Other Ambulatory Visit (HOSPITAL_COMMUNITY)
Admission: RE | Admit: 2018-11-23 | Discharge: 2018-11-23 | Disposition: A | Discharge status: Home / Self Care | Payer: Self-pay | Source: Ambulatory Visit | Attending: Physician Assistant | Admitting: Physician Assistant

## 2018-11-23 ENCOUNTER — Other Ambulatory Visit: Payer: Self-pay | Admitting: Physician Assistant

## 2018-11-23 DIAGNOSIS — R1031 Right lower quadrant pain: Secondary | ICD-10-CM

## 2018-11-23 DIAGNOSIS — E785 Hyperlipidemia, unspecified: Secondary | ICD-10-CM | POA: Insufficient documentation

## 2018-11-23 DIAGNOSIS — G8929 Other chronic pain: Secondary | ICD-10-CM

## 2018-11-23 DIAGNOSIS — R109 Unspecified abdominal pain: Secondary | ICD-10-CM

## 2018-11-23 DIAGNOSIS — R300 Dysuria: Secondary | ICD-10-CM

## 2018-11-23 DIAGNOSIS — E039 Hypothyroidism, unspecified: Secondary | ICD-10-CM | POA: Insufficient documentation

## 2018-11-23 LAB — LIPID PANEL
Cholesterol: 228 mg/dL — ABNORMAL HIGH (ref 0–200)
HDL: 39 mg/dL — ABNORMAL LOW (ref >40)
LDL Cholesterol: 132 mg/dL — ABNORMAL HIGH (ref 0–99)
Total CHOL/HDL Ratio: 5.8 RATIO
Triglycerides: 284 mg/dL — ABNORMAL HIGH (ref <150)
VLDL: 57 mg/dL — ABNORMAL HIGH (ref 0–40)

## 2018-11-23 LAB — COMPREHENSIVE METABOLIC PANEL
ALT: 22 U/L (ref 0–44)
AST: 23 U/L (ref 15–41)
Albumin: 4.1 g/dL (ref 3.5–5.0)
Alkaline Phosphatase: 101 U/L (ref 38–126)
Anion gap: 6 (ref 5–15)
BUN: 11 mg/dL (ref 6–20)
CO2: 22 mmol/L (ref 22–32)
Calcium: 8.8 mg/dL — ABNORMAL LOW (ref 8.9–10.3)
Chloride: 107 mmol/L (ref 98–111)
Creatinine, Ser: 0.54 mg/dL (ref 0.44–1.00)
GFR calc Af Amer: >60 mL/min (ref >60)
GFR calc non Af Amer: >60 mL/min (ref >60)
Glucose, Bld: 136 mg/dL — ABNORMAL HIGH (ref 70–99)
Potassium: 3.7 mmol/L (ref 3.5–5.1)
Sodium: 135 mmol/L (ref 135–145)
Total Bilirubin: 0.6 mg/dL (ref 0.3–1.2)
Total Protein: 7.4 g/dL (ref 6.5–8.1)

## 2018-11-23 LAB — TSH: TSH: 5.187 u[IU]/mL — ABNORMAL HIGH (ref 0.350–4.500)

## 2018-11-24 ENCOUNTER — Other Ambulatory Visit (HOSPITAL_COMMUNITY)
Admission: RE | Admit: 2018-11-24 | Discharge: 2018-11-24 | Disposition: A | Discharge status: Home / Self Care | Payer: Self-pay | Source: Ambulatory Visit | Attending: Physician Assistant | Admitting: Physician Assistant

## 2018-11-24 DIAGNOSIS — R69 Illness, unspecified: Secondary | ICD-10-CM | POA: Insufficient documentation

## 2018-11-24 LAB — URINALYSIS, DIPSTICK ONLY
Bilirubin Urine: NEGATIVE
Glucose, UA: NEGATIVE mg/dL
Hgb urine dipstick: NEGATIVE
Ketones, ur: NEGATIVE mg/dL
Nitrite: NEGATIVE
Protein, ur: NEGATIVE mg/dL
Specific Gravity, Urine: 1.003 — ABNORMAL LOW (ref 1.005–1.030)
pH: 7 (ref 5.0–8.0)

## 2018-11-25 LAB — URINE CULTURE: Culture: <10000 — AB

## 2018-11-29 ENCOUNTER — Other Ambulatory Visit: Payer: Self-pay

## 2018-11-29 ENCOUNTER — Ambulatory Visit (HOSPITAL_COMMUNITY)
Admission: RE | Admit: 2018-11-29 | Discharge: 2018-11-29 | Disposition: A | Discharge status: Home / Self Care | Payer: Self-pay | Source: Ambulatory Visit | Attending: Physician Assistant | Admitting: Physician Assistant

## 2018-11-29 DIAGNOSIS — R1031 Right lower quadrant pain: Secondary | ICD-10-CM | POA: Insufficient documentation

## 2018-11-29 DIAGNOSIS — G8929 Other chronic pain: Secondary | ICD-10-CM | POA: Insufficient documentation

## 2018-11-29 MED ORDER — IOHEXOL 300 MG/ML  SOLN
100.0000 mL | Freq: Once | INTRAMUSCULAR | Status: AC | PRN
  Administered 2018-11-29: 10:00:00 100 mL via INTRAVENOUS

## 2018-11-30 ENCOUNTER — Other Ambulatory Visit: Payer: Self-pay | Admitting: Physician Assistant

## 2018-11-30 MED ORDER — ACYCLOVIR 400 MG PO TABS: ORAL_TABLET | ORAL | 1 refills | Status: DC

## 2018-12-03 ENCOUNTER — Other Ambulatory Visit: Payer: Self-pay | Admitting: Physician Assistant

## 2018-12-04 ENCOUNTER — Ambulatory Visit: Payer: Self-pay | Admitting: Physician Assistant

## 2018-12-04 ENCOUNTER — Other Ambulatory Visit: Payer: Self-pay | Admitting: Physician Assistant

## 2018-12-12 ENCOUNTER — Ambulatory Visit: Payer: Self-pay | Admitting: Physician Assistant

## 2018-12-18 ENCOUNTER — Ambulatory Visit: Payer: Self-pay | Admitting: Physician Assistant

## 2019-01-03 ENCOUNTER — Ambulatory Visit: Payer: Self-pay | Admitting: Physician Assistant

## 2019-01-03 ENCOUNTER — Encounter: Payer: Self-pay | Admitting: Physician Assistant

## 2019-01-03 DIAGNOSIS — G8929 Other chronic pain: Secondary | ICD-10-CM

## 2019-01-03 DIAGNOSIS — Z789 Other specified health status: Secondary | ICD-10-CM

## 2019-01-03 DIAGNOSIS — B009 Herpesviral infection, unspecified: Secondary | ICD-10-CM

## 2019-01-03 DIAGNOSIS — R109 Unspecified abdominal pain: Secondary | ICD-10-CM

## 2019-01-03 DIAGNOSIS — R1031 Right lower quadrant pain: Secondary | ICD-10-CM

## 2019-01-03 NOTE — Progress Notes (Signed)
There were no vitals taken for this visit.   Subjective:    Patient ID: Brandi Wilson, female    DOB: 1985/06/09, 33 y.o.   MRN: [id number]  HPI: Brandi Wilson is a 33 y.o. female presenting on 01/03/2019 for Follow-up   HPI    This is a telemedicine appointment through [employer] due to coronavirus pandemic.    I connected with  Brandi Wilson on 01/03/19 by a video enabled telemedicine application and verified that I am speaking with the correct person using two identifiers.   I discussed the limitations of evaluation and management by telemedicine. The patient expressed understanding and agreed to proceed.  Pt is at home.  Provider is at office   Pt got approved for [employer] financial assistance  She says her abd pain is only hurting a little bit now.  BMs normal and regular.   She says the pain still bothers her- it comes and goes but is every day.  She says it hurts for only about 20 minutes each day two times (total about 22min/day)  She says in the moring and the afternoon.    Pain right lower abdomen and she says it is always in that same area.  No fever.    Pt has been having complaints of RLQ abdominal pain off and on for close to a year.  She has had normal [place] and normal CT.    November 2019 she was restarted on omeprazole because she was also complaining of globus sensation.   At her follow up appointment she had stopped the ppi because the globus sensation resolved.     At [facility] 2019 appt pt was also requesting to go back on her propranolol for HA prevention.   She had discontinued this at her follow up appointment.   She got the rx refilled sometime between appoitments in June and spetember becaue she wanted to get back on it.  Today she is asking if she has to keep taking this medication.       Pt is also requesting something to cure her herpes.  She says she is tired of taking the acyclovir and it's  expensive.    Pt is not working now.       Relevant past medical, surgical, family and social history reviewed and updated as indicated. Interim medical history since our last visit reviewed. Allergies and medications reviewed and updated.   Current Outpatient Medications:  .  acyclovir (ZOVIRAX) 400 MG tablet, 1 po bid.  Tome una tableta por boca dos veces diarias, Disp: 60 tablet, Rfl: 1 .  EUTHYROX 50 MCG tablet, Take 1 tablet by mouth once daily, Disp: 90 tablet, Rfl: 0 .  medroxyPROGESTERone (DEPO-PROVERA) 150 MG/ML injection, Inject 150 mg into the muscle every 3 (three) months., Disp: , Rfl:  .  propranolol (INDERAL) 10 MG tablet, Take 1 tablet by mouth twice daily.  Tome una tableta por boca dos veces diarias, Disp: 60 tablet, Rfl: 0 .  acetaminophen (TYLENOL) 500 MG tablet, Take 500 mg by mouth every 6 (six) hours as needed., Disp: , Rfl:     Review of Systems  Per HPI unless specifically indicated above     Objective:    There were no vitals taken for this visit.  Wt Readings from Last 3 Encounters:  01/25/18 167 lb (75.8 kg)  05/19/17 165 lb (74.8 kg)  05/11/17 166 lb 8 oz (75.5 kg)  Physical Exam Constitutional:      General: She is not in acute distress.    Appearance: She is not ill-appearing.  HENT:     Head: Normocephalic and atraumatic.  Pulmonary:     Effort: No respiratory distress.  Neurological:     Mental Status: She is alert and oriented to person, place, and time.  Psychiatric:        Attention and Perception: Attention normal.        Speech: Speech normal.        Behavior: Behavior is cooperative.          Assessment & Plan:    Encounter Diagnoses  Name Primary?  . Abdominal pain, unspecified abdominal location Yes  . Chronic RLQ pain   . Non-English speaking patient   . HSV infection   . Chronic nonintractable headache, unspecified headache type      Pt to keep journal of when she eats and when she is having pain.   She  will bring the journal to follow up appointment in 1 month  Counseled pt that there is no cure for HSV  Discussed with pt that she can go off the propranolol if she would like, that she is on it because she requested to for headache prevention.

## 2019-01-14 ENCOUNTER — Other Ambulatory Visit: Payer: Self-pay | Admitting: Physician Assistant

## 2019-01-22 ENCOUNTER — Encounter: Payer: Self-pay | Admitting: Physician Assistant

## 2019-01-22 ENCOUNTER — Ambulatory Visit: Payer: Self-pay | Admitting: Physician Assistant

## 2019-01-22 DIAGNOSIS — Z20822 Contact with and (suspected) exposure to covid-19: Secondary | ICD-10-CM

## 2019-01-22 DIAGNOSIS — Z789 Other specified health status: Secondary | ICD-10-CM

## 2019-01-22 DIAGNOSIS — R519 Headache, unspecified: Secondary | ICD-10-CM

## 2019-01-22 DIAGNOSIS — R509 Fever, unspecified: Secondary | ICD-10-CM

## 2019-01-22 DIAGNOSIS — J029 Acute pharyngitis, unspecified: Secondary | ICD-10-CM

## 2019-01-22 DIAGNOSIS — R52 Pain, unspecified: Secondary | ICD-10-CM

## 2019-01-22 NOTE — Progress Notes (Signed)
   There were no vitals taken for this visit.   Subjective:    Patient ID: Brandi Wilson, female    DOB: [birth year], 33 y.o.   MRN: [id number]  HPI: Brandi Wilson is a 33 y.o. female presenting on 01/22/2019 for Headache (body aches, dry throat, sub fever, chills. pt states sx began on Saturday. pt has taken an advil which has helped with her HA)   HPI  This is a telemedicine appointment through [employer] due to coronavirus pandemic.  I connected with  Brandi Wilson on 01/22/19 by a video enabled telemedicine application and verified that I am speaking with the correct person using two identifiers.   I discussed the limitations of evaluation and management by telemedicine. The patient expressed understanding and agreed to proceed.   Chief Complaint  Patient presents with  . Headache    body aches, dry throat, sub fever, chills. pt states sx began on Saturday. pt has taken an advil which has helped with her HA     She is not working currently.  She lives with her husband and 2 children (oldest is 15yo).      Relevant past medical, surgical, family and social history reviewed and updated as indicated. Interim medical history since our last visit reviewed. Allergies and medications reviewed and updated.  Review of Systems  Per HPI unless specifically indicated above     Objective:    There were no vitals taken for this visit.  Wt Readings from Last 3 Encounters:  01/25/18 167 lb (75.8 kg)  05/19/17 165 lb (74.8 kg)  05/11/17 166 lb 8 oz (75.5 kg)    Physical Exam Constitutional:      General: She is not in acute distress.    Appearance: She is not ill-appearing.  Pulmonary:     Effort: Pulmonary effort is normal.  Neurological:     Mental Status: She is alert.         Assessment & Plan:    Encounter Diagnoses  Name Primary?  . Body aches Yes  . Subjective fever   . Sore throat   . Acute nonintractable  headache, unspecified headache type   . Suspected 2019 novel coronavirus infection   . Non-English speaking patient      Pt will get tested for covid 19.  She is counseled on self-isolation.   She is to use OTC meds as needed for her symptoms.  Discussed reasons why she would need to seek care in the ER including chest pain or problems breathing.

## 2019-01-23 ENCOUNTER — Other Ambulatory Visit: Payer: Self-pay

## 2019-01-23 DIAGNOSIS — Z20822 Contact with and (suspected) exposure to covid-19: Secondary | ICD-10-CM

## 2019-01-25 LAB — NOVEL CORONAVIRUS, NAA: SARS-CoV-2, NAA: NOT DETECTED

## 2019-01-30 ENCOUNTER — Ambulatory Visit: Payer: Self-pay | Admitting: Physician Assistant

## 2019-01-30 ENCOUNTER — Encounter: Payer: Self-pay | Admitting: Physician Assistant

## 2019-01-30 DIAGNOSIS — Z789 Other specified health status: Secondary | ICD-10-CM

## 2019-01-30 DIAGNOSIS — R109 Unspecified abdominal pain: Secondary | ICD-10-CM

## 2019-01-30 MED ORDER — DICYCLOMINE HCL 20 MG PO TABS: 20.0000 mg | ORAL_TABLET | Freq: Three times a day (TID) | ORAL | 1 refills | Status: DC

## 2019-01-30 NOTE — Progress Notes (Signed)
There were no vitals taken for this visit.   Subjective:    Patient ID: Brandi Wilson, female    DOB: [birth year], 33 y.o.   MRN: [id number]  HPI: Brandi Wilson is a 33 y.o. female presenting on 01/30/2019 for Abdominal Pain   HPI   This is a telemedicine appointmen tthrough [employer] due to coronavirus pandemic.   I connected with  Brandi Wilson on 01/30/19  by a video enabled telemedicine application and verified that I am speaking with the correct person using two identifiers.   I discussed the limitations of evaluation and management by telemedicine. The patient expressed understanding and agreed to proceed.   33 yo female with ongoing complaints of pain.  Today she says the pain still bothers her- it comes and goes but it is every day.  She says it hurts for only about 20 minutes each day two times (total about 38min/day)  She says in the moring and the afternoon.  RLQ abd pain.   She has been put on omeprazole several times but she always discontinued the medication herself for no apparent reason      Pt did keep pain journal but did not record her eating as requested.  So there is no way to determine if pain is before or after eating.      Last pain was on satudary (9/14)- about 9am- lasted about 40 min Friday (11/13)- about 1pm lasted 2 hours thur- 11am lasted about 40 min Wed 10am lasted 11min tues 8am lasted 44min mon 1pm lasted 3min  Pt says she did have pain on Sunday but didn't write it down. No pain on Monday / yesterday.   She says she usually eats 1st meal of the day around 12 noon  On October 18 she says the pain lasted for 3 hours- started about 1pm until 3:30pm  She says she moves her bowels 1 or 2 times every day.    She says she has always had problems with constipation.  She says that she says that because when she lived in [place] she would move he bowels 3 times every day.    When she has the pain it  isn't so severe that she needs to stop her activities.    Pt had normal pelvic US 11/07/18 and CT 11/29/18  LMP - she is on depo-    No blood PR       Relevant past medical, surgical, family and social history reviewed and updated as indicated. Interim medical history since our last visit reviewed. Allergies and medications reviewed and updated.   Current Outpatient Medications:  .  acyclovir (ZOVIRAX) 400 MG tablet, 1 po bid.  Tome una tableta por boca dos veces diarias, Disp: 60 tablet, Rfl: 1 .  EUTHYROX 50 MCG tablet, Take 1 tablet by mouth once daily, Disp: 90 tablet, Rfl: 0 .  medroxyPROGESTERone (DEPO-PROVERA) 150 MG/ML injection, Inject 150 mg into the muscle every 3 (three) months., Disp: , Rfl:  .  propranolol (INDERAL) 10 MG tablet, Take 1 tablet by mouth twice daily, Disp: 60 tablet, Rfl: 1 .  acetaminophen (TYLENOL) 500 MG tablet, Take 500 mg by mouth every 6 (six) hours as needed., Disp: , Rfl:     Review of Systems  Per HPI unless specifically indicated above     Objective:    There were no vitals taken for this visit.  Wt Readings from Last 3 Encounters:  01/25/18 167 lb (75.8 kg)  05/19/17 165 lb (74.8 kg)  05/11/17 166 lb 8 oz (75.5 kg)    Physical Exam Constitutional:      General: She is not in acute distress.    Appearance: She is well-developed. She is not ill-appearing.  HENT:     Head: Normocephalic and atraumatic.  Pulmonary:     Effort: Pulmonary effort is normal. No respiratory distress.  Neurological:     Mental Status: She is alert and oriented to person, place, and time.  Psychiatric:        Attention and Perception: Attention normal.        Speech: Speech normal.        Behavior: Behavior is cooperative.     Results for orders placed or performed in visit on 01/23/19  Novel Coronavirus, NAA ([facility])   Specimen: Nasopharyngeal(NP) swabs in vial transport medium   NASOPHARYNGE  TESTING  Result Value Ref Range   SARS-CoV-2, NAA  Not Detected Not Detected      Assessment & Plan:   Encounter Diagnoses  Name Primary?  . Abdominal pain, unspecified abdominal location Yes  . Non-English speaking patient       -Trial of bentyl -discussed with pt that moving bowels 1 or 2 times daily does not constitute constipation that she moved her bowels too many time when she lived [place] likely due to water/sanitation issues -pt to follow up in 1 month.  If no improvement, will likely refer.  Pt to contact office sooner prn

## 2019-02-20 ENCOUNTER — Ambulatory Visit: Payer: Self-pay | Admitting: Physician Assistant

## 2019-03-29 ENCOUNTER — Ambulatory Visit: Payer: Self-pay | Admitting: Physician Assistant

## 2019-06-05 ENCOUNTER — Other Ambulatory Visit: Payer: Self-pay | Admitting: Physician Assistant

## 2019-06-15 ENCOUNTER — Ambulatory Visit: Payer: Medicaid Other

## 2019-06-15 ENCOUNTER — Ambulatory Visit: Payer: Medicaid Other | Attending: Internal Medicine

## 2019-06-15 DIAGNOSIS — Z23 Encounter for immunization: Secondary | ICD-10-CM

## 2019-06-15 NOTE — Progress Notes (Signed)
   Covid-19 Vaccination Clinic  Name:  Nannie Starzyk    MRN: [id number] DOB: April 10, 1985  06/15/2019  Ms. jenevieve kirschbaum was observed post Covid-19 immunization for 15 minutes without incident. She was provided with Vaccine Information Sheet and instruction to access the V-Safe system.   Ms. juliza machnik was instructed to call 911 with any severe reactions post vaccine: Marland Kitchen Difficulty breathing  . Swelling of face and throat  . A fast heartbeat  . A bad rash all over body  . Dizziness and weakness   Immunizations Administered    Name Date Dose VIS Date Route   Pfizer COVID-19 Vaccine 06/15/2019  2:31 PM 0.3 mL 02/23/2019 Intramuscular   Manufacturer: [employer], [employer]   Lot: [id number]   NDC: [address]-8

## 2019-07-06 ENCOUNTER — Ambulatory Visit: Payer: Medicaid Other | Attending: Internal Medicine

## 2019-07-06 ENCOUNTER — Ambulatory Visit: Payer: Medicaid Other

## 2019-07-06 DIAGNOSIS — Z23 Encounter for immunization: Secondary | ICD-10-CM

## 2019-07-06 NOTE — Progress Notes (Signed)
   Covid-19 Vaccination Clinic  Name:  Darrion Macaulay    MRN: [id number] DOB: 1985-11-30  07/06/2019  Ms. mai longnecker was observed post Covid-19 immunization for 15 minutes without incident. She was provided with Vaccine Information Sheet and instruction to access the V-Safe system.   Ms. delta pichon was instructed to call 911 with any severe reactions post vaccine: Marland Kitchen Difficulty breathing  . Swelling of face and throat  . A fast heartbeat  . A bad rash all over body  . Dizziness and weakness   Immunizations Administered    Name Date Dose VIS Date Route   Pfizer COVID-19 Vaccine 07/06/2019  4:21 PM 0.3 mL 05/09/2018 Intramuscular   Manufacturer: [employer], [employer]   Lot: [id number]   NDC: [address]-2

## 2019-07-10 ENCOUNTER — Ambulatory Visit: Payer: Self-pay | Admitting: Physician Assistant

## 2019-07-16 ENCOUNTER — Ambulatory Visit: Payer: Medicaid Other | Admitting: Physician Assistant

## 2019-07-16 ENCOUNTER — Encounter: Payer: Self-pay | Admitting: Physician Assistant

## 2019-07-16 ENCOUNTER — Other Ambulatory Visit: Payer: Self-pay | Admitting: Physician Assistant

## 2019-07-16 ENCOUNTER — Other Ambulatory Visit: Payer: Self-pay

## 2019-07-16 ENCOUNTER — Other Ambulatory Visit (HOSPITAL_COMMUNITY)
Admission: RE | Admit: 2019-07-16 | Discharge: 2019-07-16 | Disposition: A | Discharge status: Home / Self Care | Payer: Medicaid Other | Source: Ambulatory Visit | Attending: Physician Assistant | Admitting: Physician Assistant

## 2019-07-16 VITALS — BP 110/76 | HR 83 | Temp 97.2°F | Wt 180.8 lb

## 2019-07-16 DIAGNOSIS — E669 Obesity, unspecified: Secondary | ICD-10-CM

## 2019-07-16 DIAGNOSIS — E039 Hypothyroidism, unspecified: Secondary | ICD-10-CM

## 2019-07-16 DIAGNOSIS — Z789 Other specified health status: Secondary | ICD-10-CM

## 2019-07-16 DIAGNOSIS — B009 Herpesviral infection, unspecified: Secondary | ICD-10-CM

## 2019-07-16 LAB — TSH: TSH: 10.554 u[IU]/mL — ABNORMAL HIGH (ref 0.350–4.500)

## 2019-07-16 NOTE — Progress Notes (Signed)
   BP 110/76   Pulse 83   Temp (!) 97.2 F (36.2 C)   Wt 180 lb 12.8 oz (82 kg)   SpO2 100%   BMI 36.21 kg/m    Subjective:    Patient ID: Brandi Wilson, female    DOB: 1985/12/08, 34 y.o.   MRN: [id number]  HPI: Brandi Wilson is a 34 y.o. female presenting on 07/16/2019 for Follow-up   HPI  Pt had a negative covid 19 screenging questionnaire.    Pt is 34yoF who is Long past due for f/u;  She has Cancelled many appts  She has been out of thyroid meds > 3 months.    She wonders if she needs the rx.   Lipids also elevated at last check .  She says she is not eating lowfat diet.    She does not work.     Relevant past medical, surgical, family and social history reviewed and updated as indicated. Interim medical history since our last visit reviewed. Allergies and medications reviewed and updated.  Current meds: Acyclovir Depo-provera  Review of Systems  Per HPI unless specifically indicated above     Objective:    BP 110/76   Pulse 83   Temp (!) 97.2 F (36.2 C)   Wt 180 lb 12.8 oz (82 kg)   SpO2 100%   BMI 36.21 kg/m   Wt Readings from Last 3 Encounters:  07/16/19 180 lb 12.8 oz (82 kg)  01/25/18 167 lb (75.8 kg)  05/19/17 165 lb (74.8 kg)    Physical Exam Vitals reviewed.  Constitutional:      General: She is not in acute distress.    Appearance: She is well-developed. She is not ill-appearing.  HENT:     Head: Normocephalic and atraumatic.  Cardiovascular:     Rate and Rhythm: Normal rate and regular rhythm.  Pulmonary:     Effort: Pulmonary effort is normal.     Breath sounds: Normal breath sounds.  Abdominal:     General: Bowel sounds are normal.     Palpations: Abdomen is soft. There is no mass.     Tenderness: There is no abdominal tenderness.  Musculoskeletal:     Cervical back: Neck supple.     Right lower leg: No edema.     Left lower leg: No edema.  Lymphadenopathy:     Cervical: No cervical  adenopathy.  Skin:    General: Skin is warm and dry.  Neurological:     Mental Status: She is alert and oriented to person, place, and time.  Psychiatric:        Attention and Perception: Attention normal.        Speech: Speech normal.        Behavior: Behavior is cooperative.            Assessment & Plan:    Encounter Diagnoses  Name Primary?  . Hypothyroidism, unspecified type Yes  . Obesity, unspecified classification, unspecified obesity type, unspecified whether serious comorbidity present   . HSV infection   . Non-English speaking patient       -will check thyroid labs -counseled healthy diet and regular exercise -will schedule pt to follow up in one year but discussed that if thyroid abnormal she will need to RTO sooner.  She states understanding.

## 2019-07-16 NOTE — Patient Instructions (Signed)
Preventing Health Risks of Being Overweight Maintaining a healthy body weight is an important part of your overall health. Your healthy body weight depends on your age, gender, and height. Being overweight puts you at risk for many health problems, including:  Heart disease.  Diabetes.  Problems sleeping.  Joint problems. You can make changes to your diet and lifestyle to prevent these risks. Consider working with a health care provider or a dietitian to make these changes. What nutrition changes can be made?   Eat only as much as your body needs. In most cases, this is about 2,000 calories a day, but the amount varies depending on your height, gender, and activity level. Ask your health care provider how many calories you should have each day. Eating more than your body needs on a regular basis can cause you to become overweight or obese.  Eat slowly, and stop eating when you feel full.  Choose healthy foods, including: ? Fruits and vegetables. ? Lean meats. ? Low-fat dairy products. ? High-fiber foods, such as whole grains and beans. ? Healthy snacks like vegetable sticks, a piece of fruit, or a small amount of yogurt or cheese.  Avoid foods and drinks that are high in sugar, salt (sodium), saturated fat, or trans fat. This includes: ? Many desserts such as candy, cookies, and ice cream. ? Soda. ? Fried foods. ? Processed meats such as hot dogs or lunch meats. ? Prepackaged snack foods. What lifestyle changes can be made?   Exercise for at least 150 minutes a week to prevent weight gain, or as often as recommended by your health care provider. Do moderate-intensity exercise, such as brisk walking. ? Spread it out by exercising for 30 minutes 5 days a week, or in short 10-minute bursts several times a day.  Find other ways to stay active and burn calories, such as yard work or a hobby that involves physical activity.  Get at least 8 hours of sleep each night. When you are  well-rested, you are more likely to be active and make healthy choices during the day. To sleep better: ? Try to go to bed and wake up at about the same time every day. ? Keep your bedroom dark, quiet, and cool. ? Make sure that your bed is comfortable. ? Avoid stimulating activities, such as watching television or exercising, for at least one hour before bedtime. Why are these changes important? Eating healthy and being active helps you lose weight and prevent health problems caused by being overweight. Making these changes can also help you manage stress, feel better mentally, and connect with friends and family. What can happen if changes are not made? Being overweight can affect you for your entire life. You may develop joint or bone problems that make it painful or difficult for you to play sports or do activities you enjoy. Being overweight puts stress on your heart and lungs and can lead to medical problems like diabetes, heart disease, and sleeping problems. Where to find support You can get support for preventing health risks of being overweight from:  Your health care provider or a dietitian. They can provide guidance about healthy eating and healthy lifestyle choices.  Weight loss support groups, online or in-person. Where to find more information  MyPlate: www.choosemyplate.gov ? This an online tool that provides personalized recommendations about foods to eat each day.  The Centers for Disease Control and Prevention: www.cdc.gov/healthyweight ? This resource gives tips for managing weight and having an active lifestyle.   Summary  To prevent unhealthy weight gain, it is important to maintain a healthy diet high in vegetables and whole grains, exercise regularly, and get at least 8 hours of sleep each night.  Making these changes helps prevent many long-term (chronic) health conditions that can shorten your life, such as diabetes, heart disease, and stroke. This information is  not intended to replace advice given to you by your health care provider. Make sure you discuss any questions you have with your health care provider. Document Revised: 11/22/2018 Document Reviewed: 01/26/2017 Elsevier Patient Education  2020 Elsevier Inc.  

## 2019-07-19 ENCOUNTER — Other Ambulatory Visit: Payer: Self-pay | Admitting: Physician Assistant

## 2019-07-20 ENCOUNTER — Other Ambulatory Visit: Payer: Self-pay | Admitting: Physician Assistant

## 2019-07-20 MED ORDER — LEVOTHYROXINE SODIUM 25 MCG PO TABS: 25.0000 ug | ORAL_TABLET | Freq: Every day | ORAL | 4 refills | Status: DC

## 2019-08-07 ENCOUNTER — Encounter: Payer: Self-pay | Admitting: Physician Assistant

## 2019-08-07 ENCOUNTER — Other Ambulatory Visit: Payer: Self-pay

## 2019-08-07 ENCOUNTER — Ambulatory Visit: Payer: Medicaid Other | Admitting: Physician Assistant

## 2019-08-07 VITALS — BP 110/80 | HR 101 | Temp 98.1°F | Wt 177.4 lb

## 2019-08-07 DIAGNOSIS — H1032 Unspecified acute conjunctivitis, left eye: Secondary | ICD-10-CM

## 2019-08-07 DIAGNOSIS — Z789 Other specified health status: Secondary | ICD-10-CM

## 2019-08-07 MED ORDER — ERYTHROMYCIN 5 MG/GM OP OINT: TOPICAL_OINTMENT | OPHTHALMIC | 0 refills | Status: DC

## 2019-08-07 NOTE — Progress Notes (Signed)
   BP 110/80   Pulse (!) 101   Temp 98.1 F (36.7 C)   Wt 177 lb 6.4 oz (80.5 kg)   SpO2 98%   BMI 35.53 kg/m    Subjective:    Patient ID: Brandi Wilson, female    DOB: Nov 08, 1985, 34 y.o.   MRN: [id number]  HPI: Brandi Wilson is a 34 y.o. female presenting on 08/07/2019 for Eye Problem (felt burning in L eye on Sunday, 08-04-19. pt has L eye redness, swelling, pain, itching, and light sensitivity.)   HPI  Pt had a negative covid 19 screening questionnaire   Pt says A little piece of trash went into her eye.  She thinks leaf or something organic.   It burns and hurts  She used visine.   She does Not wear glasses or contacts.    No other complaints besides the eye     Relevant past medical, surgical, family and social history reviewed and updated as indicated. Interim medical history since our last visit reviewed. Allergies and medications reviewed and updated.  Review of Systems  Per HPI unless specifically indicated above     Objective:    BP 110/80   Pulse (!) 101   Temp 98.1 F (36.7 C)   Wt 177 lb 6.4 oz (80.5 kg)   SpO2 98%   BMI 35.53 kg/m   Wt Readings from Last 3 Encounters:  08/07/19 177 lb 6.4 oz (80.5 kg)  07/16/19 180 lb 12.8 oz (82 kg)  01/25/18 167 lb (75.8 kg)     Hearing Screening   125Hz  250Hz  500Hz  1000Hz  2000Hz  3000Hz  4000Hz  6000Hz  8000Hz   Right ear:           Left ear:             Visual Acuity Screening   Right eye Left eye Both eyes  Without correction: 20/25 20/70 20/25   With correction:          Physical Exam Vitals and nursing note reviewed.  Constitutional:      General: She is not in acute distress. HENT:     Head: Normocephalic and atraumatic.  Eyes:     General: Lids are normal. Lids are everted, no foreign bodies appreciated. Vision grossly intact.        Right eye: No foreign body.        Left eye: No foreign body.     Conjunctiva/sclera:     Left eye: Left conjunctiva is  injected. No hemorrhage.    Pupils: Pupils are equal, round, and reactive to light.     Comments: 1 gtt proparacaine OS.   Then flourescein instilled.  Exam with woods lamp reveals no uptake.  There is no abrasion or ulceration seen.    Pulmonary:     Effort: No respiratory distress.  Neurological:     Mental Status: She is alert and oriented to person, place, and time.  Psychiatric:        Behavior: Behavior normal.              Assessment & Plan:     Encounter Diagnoses  Name Primary?  . Acute conjunctivitis of left eye, unspecified acute conjunctivitis type Yes  . Non-English speaking patient       rx emycin ointment.  Likely infection caused by organic matter in the eye.  She is to contact office Monday if she isn't much improved

## 2019-08-07 NOTE — Patient Instructions (Signed)
Conjuntivitis bacteriana en los adultos Bacterial Conjunctivitis, Adult La conjuntivitis bacteriana es una infeccin de la membrana transparente que cubre la parte blanca del ojo y la cara interna del prpado (conjuntiva). Cuando los vasos sanguneos de la conjuntiva se inflaman, el ojo se pone rojo o rosa, y es posible que le pique. La conjuntivitis bacteriana se transmite fcilmente de una persona a la otra (es contagiosa). Tambin se contagia fcilmente de un ojo al otro. Cules son las causas? Esta afeccin est causada por bacterias. Puede contraer la infeccin si entra en contacto estrecho con:  Una persona que est infectada por la bacteria.  Elementos que estn contaminados con la bacteria, como una toalla para la cara, solucin para lentes de contacto o maquillaje para ojos. Qu incrementa el riesgo? Es ms probable que tenga esta afeccin si:  Mantiene contacto fsico con personas que tienen la infeccin.  Usa lentes de contacto.  Tiene sinusitis.  Ha tenido una lesin o ciruga reciente en el ojo.  Tiene debilitado el sistema de defensa del organismo (sistema inmunitario).  Tiene una afeccin mdica que causa sequedad en los ojos. Cules son los signos o los sntomas? Los sntomas de esta afeccin incluyen:  Secrecin espesa y amarillenta del ojo. Esta secrecin puede convertirse en una costra en el prpado durante la noche, que hace que los prpados se peguen.  Lagrimeo u ojos llorosos.  Picazn en los ojos.  Sensacin de ardor en los ojos.  Ojos rojos.  Hinchazn de los prpados.  Visin borrosa. Cmo se diagnostica? Esta afeccin se diagnostica en funcin de los sntomas y los antecedentes mdicos. El mdico tambin puede obtener una muestra de la secrecin del ojo para averiguar la causa de la infeccin. Esto no se hace con frecuencia. Cmo se trata? El tratamiento de esta afeccin puede incluir:  Gotas o ungento para los ojos con antibitico para  erradicar la infeccin con ms rapidez y evitar el contagio a otras personas.  Antibiticos por va oral para tratar infecciones que no responden a las gotas o los ungentos, o que duran ms de 10das.  Paos hmedos fros (compresas fras) sobre los ojos.  Lgrimas artificiales aplicadas 2 a 6 veces por da. Siga estas indicaciones en su casa: Medicamentos  Tome los antibiticos o aplqueselos como se lo haya indicado el mdico. No deje de tomar los antibiticos o de aplicrselos aunque comience a sentirse mejor.  Tome o aplquese los medicamentos de venta libre y los recetados solamente como se lo haya indicado el mdico.  Tenga mucho cuidado de no tocar el borde del prpado con el frasco de las gotas para los ojos o el tubo del ungento cuando aplica los medicamentos en el ojo afectado. Esto evitar que se contagie la infeccin al otro ojo o a otras personas. Control de las molestias  Retire suavemente la secrecin de los ojos con un pao tibio y hmedo o con una torunda de algodn.  Aplquese una compresa fra y limpia en el ojo durante 10 a 20 minutos, 3 o 4 veces al da. Indicaciones generales  No use lentes de contacto hasta que haya desaparecido la inflamacin y su mdico le indique que es seguro usarlos nuevamente. Pregntele al mdico cmo esterilizar o reemplazar sus lentes de contacto antes de usarlos nuevamente. Use anteojos hasta que pueda volver a usar los lentes de contacto.  Evite usar maquillaje en los ojos hasta que la inflamacin se haya ido. Descarte cosmticos viejos para los ojos que puedan estar contaminados.  Cambie   o lave su almohada todos los das.  No comparta las toallas o los paos. Esto puede propagar la infeccin.  Lvese las manos frecuentemente con agua y jabn. Use toallas de papel para secarse las manos.  Evite tocarse o frotarse los ojos.  No conduzca ni use maquinaria pesada si su visin es borrosa. Comunquese con un mdico si:  Tiene  fiebre.  Los sntomas no mejoran despus de 10das de tratamiento. Solicite ayuda inmediatamente si tiene:  Fiebre y los sntomas empeoran repentinamente.  Dolor intenso cuando mueve el ojo.  Dolor, enrojecimiento o hinchazn en la cara.  Prdida repentina de la visin. Resumen  La conjuntivitis bacteriana es una infeccin de la membrana transparente que cubre la parte blanca del ojo y la cara interna del prpado (conjuntiva).  La conjuntivitis bacteriana se transmite fcilmente de una persona a la otra (es contagiosa).  Lvese las manos frecuentemente con agua y jabn. Use toallas de papel para secarse las manos.  Tome los antibiticos o aplqueselos como se lo haya indicado el mdico. No deje de tomar los antibiticos o de aplicrselos aunque comience a sentirse mejor.  Comunquese con un mdico si tiene fiebre o si los sntomas no mejoran despus de 10 das. Esta informacin no tiene como fin reemplazar el consejo del mdico. Asegrese de hacerle al mdico cualquier pregunta que tenga. Document Revised: 11/02/2017 Document Reviewed: 11/02/2017 Elsevier Patient Education  2020 Elsevier Inc.  

## 2019-11-01 ENCOUNTER — Other Ambulatory Visit: Payer: Self-pay | Admitting: Physician Assistant

## 2019-11-01 DIAGNOSIS — E039 Hypothyroidism, unspecified: Secondary | ICD-10-CM

## 2019-11-01 NOTE — Progress Notes (Unsigned)
.  h 

## 2019-11-12 ENCOUNTER — Other Ambulatory Visit (HOSPITAL_COMMUNITY)
Admission: RE | Admit: 2019-11-12 | Discharge: 2019-11-12 | Disposition: A | Discharge status: Home / Self Care | Payer: Medicaid Other | Source: Ambulatory Visit | Attending: Physician Assistant | Admitting: Physician Assistant

## 2019-11-12 DIAGNOSIS — E039 Hypothyroidism, unspecified: Secondary | ICD-10-CM | POA: Insufficient documentation

## 2019-11-12 LAB — TSH: TSH: 10.315 u[IU]/mL — ABNORMAL HIGH (ref 0.350–4.500)

## 2019-11-14 ENCOUNTER — Ambulatory Visit: Payer: Medicaid Other | Admitting: Physician Assistant

## 2019-11-21 ENCOUNTER — Ambulatory Visit: Payer: Medicaid Other | Admitting: Physician Assistant

## 2019-11-21 ENCOUNTER — Encounter: Payer: Self-pay | Admitting: Physician Assistant

## 2019-11-21 ENCOUNTER — Other Ambulatory Visit: Payer: Self-pay

## 2019-11-21 VITALS — BP 106/70 | HR 80 | Temp 96.6°F | Ht 59.0 in | Wt 177.0 lb

## 2019-11-21 DIAGNOSIS — Z131 Encounter for screening for diabetes mellitus: Secondary | ICD-10-CM

## 2019-11-21 DIAGNOSIS — E669 Obesity, unspecified: Secondary | ICD-10-CM

## 2019-11-21 DIAGNOSIS — E039 Hypothyroidism, unspecified: Secondary | ICD-10-CM

## 2019-11-21 DIAGNOSIS — R7309 Other abnormal glucose: Secondary | ICD-10-CM

## 2019-11-21 DIAGNOSIS — Z789 Other specified health status: Secondary | ICD-10-CM

## 2019-11-21 DIAGNOSIS — E785 Hyperlipidemia, unspecified: Secondary | ICD-10-CM

## 2019-11-21 MED ORDER — LEVOTHYROXINE SODIUM 25 MCG PO TABS: 25.0000 ug | ORAL_TABLET | Freq: Every day | ORAL | 4 refills | Status: DC

## 2019-11-21 NOTE — Progress Notes (Signed)
BP 106/70    Pulse 80    Temp (!) 96.6 F (35.9 C)    Ht 4\' 11"  (1.499 m)    Wt 177 lb (80.3 kg)    SpO2 99%    BMI 35.75 kg/m    Subjective:    Patient ID: , female    DOB: [birth year], 34 y.o.   MRN: [id number]  HPI: Brandi Wilson is a 34 y.o. female presenting on 11/21/2019 for Thyroid Problem   HPI   Pt had a negative covid 19 screening questionnaire.   Pt is 34yoF with appointment today to follow up hypothyroidism.  Pt not taking her thyroid medication.  She says she just didn't get it refilled.  She says she felt better while she was taking it- not sluggish and drowsy.  She has no other concerns today.     Relevant past medical, surgical, family and social history reviewed and updated as indicated. Interim medical history since our last visit reviewed. Allergies and medications reviewed and updated.   Current Outpatient Medications:    medroxyPROGESTERone (DEPO-PROVERA) 150 MG/ML injection, Inject 150 mg into the muscle every 3 (three) months., Disp: , Rfl:    acyclovir (ZOVIRAX) 400 MG tablet, Take 1 tablet by mouth twice daily  Tome una tableta por 01/21/2020 diarias (Patient not taking: Reported on 08/07/2019), Disp: 60 tablet, Rfl: 4   erythromycin ophthalmic ointment, 1/2 inch ribbon left eye 4 times daily for 5-7 days.  tira de 1/2 pulagada al ojo izquierdo 4 veces al dia por 5-7 dias (Patient not taking: Reported on 11/21/2019), Disp: 3.5 g, Rfl: 0   levothyroxine (SYNTHROID) 25 MCG tablet, Take 1 tablet (25 mcg total) by mouth daily before breakfast. Tome una tableta por boca diaria (Patient not taking: Reported on 08/07/2019), Disp: 30 tablet, Rfl: 4     Review of Systems  Per HPI unless specifically indicated above     Objective:    BP 106/70    Pulse 80    Temp (!) 96.6 F (35.9 C)    Ht 4\' 11"  (1.499 m)    Wt 177 lb (80.3 kg)    SpO2 99%    BMI 35.75 kg/m   Wt Readings from Last 3 Encounters:   11/21/19 177 lb (80.3 kg)  08/07/19 177 lb 6.4 oz (80.5 kg)  07/16/19 180 lb 12.8 oz (82 kg)    Physical Exam Vitals reviewed.  Constitutional:      General: She is not in acute distress.    Appearance: She is well-developed. She is obese. She is not ill-appearing.  HENT:     Head: Normocephalic and atraumatic.  Cardiovascular:     Rate and Rhythm: Normal rate and regular rhythm.  Pulmonary:     Effort: Pulmonary effort is normal.     Breath sounds: Normal breath sounds.  Abdominal:     General: Bowel sounds are normal.     Palpations: Abdomen is soft. There is no mass.     Tenderness: There is no abdominal tenderness.  Musculoskeletal:     Cervical back: Neck supple.     Right lower leg: No edema.     Left lower leg: No edema.  Lymphadenopathy:     Cervical: No cervical adenopathy.  Skin:    General: Skin is warm and dry.  Neurological:     Mental Status: She is alert and oriented to person, place, and time.  Psychiatric:  Behavior: Behavior normal.     Results for orders placed or performed during the hospital encounter of 11/12/19  TSH  Result Value Ref Range   TSH 10.315 (H) 0.350 - 4.500 uIU/mL      Assessment & Plan:     Encounter Diagnoses  Name Primary?   Hypothyroidism, unspecified type Yes   Non-English speaking patient    Obesity, unspecified classification, unspecified obesity type, unspecified whether serious comorbidity present      -reviewed labs with pt -discussed with pt taking the synthroid versus not taking it but she says she felt better while taking it so would like to go back on it -pt declined updating Tdap today -pt to follow up 4 months.  She is to contact office sooner prn

## 2019-12-27 ENCOUNTER — Other Ambulatory Visit: Payer: Self-pay | Admitting: Physician Assistant

## 2019-12-27 MED ORDER — PROPRANOLOL HCL 10 MG PO TABS: ORAL_TABLET | ORAL | 3 refills | Status: DC

## 2020-03-21 ENCOUNTER — Other Ambulatory Visit: Payer: Self-pay

## 2020-03-21 ENCOUNTER — Other Ambulatory Visit (HOSPITAL_COMMUNITY)
Admission: RE | Admit: 2020-03-21 | Discharge: 2020-03-21 | Disposition: A | Discharge status: Home / Self Care | Payer: Commercial Managed Care - PPO | Source: Ambulatory Visit | Attending: Physician Assistant | Admitting: Physician Assistant

## 2020-03-21 DIAGNOSIS — Z131 Encounter for screening for diabetes mellitus: Secondary | ICD-10-CM

## 2020-03-21 DIAGNOSIS — E785 Hyperlipidemia, unspecified: Secondary | ICD-10-CM

## 2020-03-21 DIAGNOSIS — E039 Hypothyroidism, unspecified: Secondary | ICD-10-CM | POA: Diagnosis present

## 2020-03-21 DIAGNOSIS — R7309 Other abnormal glucose: Secondary | ICD-10-CM | POA: Diagnosis not present

## 2020-03-21 DIAGNOSIS — R69 Illness, unspecified: Secondary | ICD-10-CM | POA: Diagnosis present

## 2020-03-21 LAB — LIPID PANEL
Cholesterol: 233 mg/dL — ABNORMAL HIGH (ref 0–200)
HDL: 40 mg/dL — ABNORMAL LOW (ref >40)
LDL Cholesterol: 151 mg/dL — ABNORMAL HIGH (ref 0–99)
Total CHOL/HDL Ratio: 5.8 RATIO
Triglycerides: 210 mg/dL — ABNORMAL HIGH (ref <150)
VLDL: 42 mg/dL — ABNORMAL HIGH (ref 0–40)

## 2020-03-21 LAB — COMPREHENSIVE METABOLIC PANEL
ALT: 19 U/L (ref 0–44)
AST: 19 U/L (ref 15–41)
Albumin: 4.2 g/dL (ref 3.5–5.0)
Alkaline Phosphatase: 109 U/L (ref 38–126)
Anion gap: 7 (ref 5–15)
BUN: 9 mg/dL (ref 6–20)
CO2: 23 mmol/L (ref 22–32)
Calcium: 9.2 mg/dL (ref 8.9–10.3)
Chloride: 107 mmol/L (ref 98–111)
Creatinine, Ser: 0.59 mg/dL (ref 0.44–1.00)
GFR, Estimated: >60 mL/min (ref >60)
Glucose, Bld: 99 mg/dL (ref 70–99)
Potassium: 3.9 mmol/L (ref 3.5–5.1)
Sodium: 137 mmol/L (ref 135–145)
Total Bilirubin: 0.7 mg/dL (ref 0.3–1.2)
Total Protein: 7.9 g/dL (ref 6.5–8.1)

## 2020-03-21 LAB — TSH: TSH: 6.891 u[IU]/mL — ABNORMAL HIGH (ref 0.350–4.500)

## 2020-03-21 LAB — HEMOGLOBIN A1C
Hgb A1c MFr Bld: 5.4 % (ref 4.8–5.6)
Mean Plasma Glucose: 108.28 mg/dL

## 2020-03-24 ENCOUNTER — Other Ambulatory Visit: Payer: Self-pay

## 2020-03-24 ENCOUNTER — Ambulatory Visit: Payer: Medicaid Other | Admitting: Physician Assistant

## 2020-03-24 ENCOUNTER — Encounter: Payer: Self-pay | Admitting: Physician Assistant

## 2020-03-24 VITALS — BP 100/68 | HR 78 | Temp 97.1°F

## 2020-03-24 DIAGNOSIS — E039 Hypothyroidism, unspecified: Secondary | ICD-10-CM

## 2020-03-24 DIAGNOSIS — Z789 Other specified health status: Secondary | ICD-10-CM

## 2020-03-24 DIAGNOSIS — G4489 Other headache syndrome: Secondary | ICD-10-CM

## 2020-03-24 DIAGNOSIS — E785 Hyperlipidemia, unspecified: Secondary | ICD-10-CM

## 2020-03-24 MED ORDER — PROPRANOLOL HCL 10 MG PO TABS: ORAL_TABLET | ORAL | 0 refills | Status: DC

## 2020-03-24 MED ORDER — LEVOTHYROXINE SODIUM 25 MCG PO TABS: 25.0000 ug | ORAL_TABLET | Freq: Every day | ORAL | 0 refills | Status: DC

## 2020-03-24 NOTE — Patient Instructions (Signed)
[facility] -  [phone]

## 2020-03-24 NOTE — Progress Notes (Signed)
BP 100/68   Pulse 78   Temp (!) 97.1 F (36.2 C)   SpO2 99%    Subjective:    Patient ID: Brandi Wilson, female    DOB: Feb 01, 1986, 35 y.o.   MRN: [id number]  HPI: Brandi Wilson is a 35 y.o. female presenting on 03/24/2020 for No chief complaint on file.   HPI   Pt had a negative covid 19 screening questionnaire.   Pt is 34yoF who presents for routine follow up.  Chart indicates that pt has insurance.- [employer]- card was scanned.    Pt admits that yes, she does have insurance now.  Relevant past medical, surgical, family and social history reviewed and updated as indicated. Interim medical history since our last visit reviewed. Allergies and medications reviewed and updated.   Current Outpatient Medications:  .  acyclovir (ZOVIRAX) 400 MG tablet, Take 1 tablet by mouth twice daily  Tome una tableta por [employer] diarias (Patient taking differently: Take 1 tablet by mouth twice daily  Tome una tableta por [employer] diarias), Disp: 60 tablet, Rfl: 4 .  BIOTIN PO, Take 1 tablet by mouth daily., Disp: , Rfl:  .  medroxyPROGESTERone (DEPO-PROVERA) 150 MG/ML injection, Inject 150 mg into the muscle every 3 (three) months., Disp: , Rfl:  .  Omega-3 Fatty Acids (FISH OIL PO), Take 1 capsule by mouth daily., Disp: , Rfl:  .  levothyroxine (SYNTHROID) 25 MCG tablet, Take 1 tablet (25 mcg total) by mouth daily before breakfast. Tome una tableta por boca diaria, Disp: 30 tablet, Rfl: 0 .  propranolol (INDERAL) 10 MG tablet, 1 po bid for headache prevention.  Tome una tableta por [employer] veces diarias para prevencion de dolor de cabeza, Disp: 60 tablet, Rfl: 0    Review of Systems  Per HPI unless specifically indicated above     Objective:    BP 100/68   Pulse 78   Temp (!) 97.1 F (36.2 C)   SpO2 99%   Wt Readings from Last 3 Encounters:  11/21/19 177 lb (80.3 kg)  08/07/19 177 lb 6.4 oz (80.5 kg)  07/16/19 180 lb 12.8 oz (82 kg)     Physical Exam Constitutional:      General: She is not in acute distress.    Appearance: She is not ill-appearing.  HENT:     Head: Normocephalic and atraumatic.  Pulmonary:     Effort: No respiratory distress.  Neurological:     Mental Status: She is alert and oriented to person, place, and time.  Psychiatric:        Attention and Perception: Attention normal.        Speech: Speech normal.        Behavior: Behavior is cooperative.     Results for orders placed or performed during the hospital encounter of 03/21/20  Hemoglobin A1c  Result Value Ref Range   Hgb A1c MFr Bld 5.4 4.8 - 5.6 %   Mean Plasma Glucose 108.28 mg/dL  TSH  Result Value Ref Range   TSH 6.891 (H) 0.350 - 4.500 uIU/mL  Lipid panel  Result Value Ref Range   Cholesterol 233 (H) 0 - 200 mg/dL   Triglycerides 269 (H) <150 mg/dL   HDL 40 (L) >48 mg/dL   Total CHOL/HDL Ratio 5.8 RATIO   VLDL 42 (H) 0 - 40 mg/dL   LDL Cholesterol 546 (H) 0 - 99 mg/dL  Comprehensive metabolic panel  Result Value  Ref Range   Sodium 137 135 - 145 mmol/L   Potassium 3.9 3.5 - 5.1 mmol/L   Chloride 107 98 - 111 mmol/L   CO2 23 22 - 32 mmol/L   Glucose, Bld 99 70 - 99 mg/dL   BUN 9 6 - 20 mg/dL   Creatinine, Ser 8.67 0.44 - 1.00 mg/dL   Calcium 9.2 8.9 - 67.2 mg/dL   Total Protein 7.9 6.5 - 8.1 g/dL   Albumin 4.2 3.5 - 5.0 g/dL   AST 19 15 - 41 U/L   ALT 19 0 - 44 U/L   Alkaline Phosphatase 109 38 - 126 U/L   Total Bilirubin 0.7 0.3 - 1.2 mg/dL   GFR, Estimated >09 >47 mL/min   Anion gap 7 5 - 15      Assessment & Plan:    Encounter Diagnoses  Name Primary?  . Hypothyroidism, unspecified type Yes  . Headache syndrome   . Hyperlipidemia, unspecified hyperlipidemia type   . Non-English speaking patient       -reviewed labs with pt.  Discussed with pt that she will need to establish care with new PCP as she is no longer eligible since she has insurance.  Discussed that her cholesterol is still high and that  should be addressed with her new PCP.  She is given refill on current meds and a printed copy of her labs

## 2020-06-10 ENCOUNTER — Encounter: Payer: Self-pay | Admitting: Obstetrics & Gynecology

## 2020-06-10 ENCOUNTER — Ambulatory Visit: Payer: Commercial Managed Care - PPO | Admitting: Obstetrics & Gynecology

## 2020-06-10 ENCOUNTER — Other Ambulatory Visit: Payer: Self-pay

## 2020-06-10 VITALS — BP 115/82 | HR 86 | Ht 59.2 in | Wt 181.0 lb

## 2020-06-10 DIAGNOSIS — G588 Other specified mononeuropathies: Secondary | ICD-10-CM

## 2020-06-10 MED ORDER — GABAPENTIN 300 MG PO CAPS: 300.0000 mg | ORAL_CAPSULE | Freq: Three times a day (TID) | ORAL | 3 refills | Status: DC

## 2020-06-13 ENCOUNTER — Telehealth: Payer: Self-pay | Admitting: Obstetrics & Gynecology

## 2020-06-13 NOTE — Telephone Encounter (Signed)
[facility] dept.wants to know if Dr. Despina Hidden can send over doctor's notes from the 06/10/20 office visit to the Referring Doctor. Clinical staff will follow up.

## 2020-06-20 ENCOUNTER — Telehealth: Payer: Self-pay | Admitting: Obstetrics & Gynecology

## 2020-06-20 NOTE — Telephone Encounter (Signed)
Patient came into the office stating that she would like for Dr. Despina Hidden to place a referral for her to go see someone here in [place] for her Gallbladder. Patient would like to know if we could make the referral as soon as possible. Patient states that she is in pain. Please inform me when the referral has been place so I can inform the patient to wait for a phone call. Pt speak spanish

## 2020-06-20 NOTE — Telephone Encounter (Signed)
She would need to have the Health Department evaluate her for that and schedule ultrasound etc if they think that is appropriate.  I didn't tell her she needed a referral for gallbladder just that her intermittent RUQ pain could be gllbladder, that is all.

## 2020-06-24 ENCOUNTER — Other Ambulatory Visit: Payer: Self-pay | Admitting: Physician Assistant

## 2020-06-24 ENCOUNTER — Other Ambulatory Visit (HOSPITAL_COMMUNITY): Payer: Self-pay | Admitting: Physician Assistant

## 2020-06-24 DIAGNOSIS — R1011 Right upper quadrant pain: Secondary | ICD-10-CM

## 2020-07-10 ENCOUNTER — Ambulatory Visit (HOSPITAL_COMMUNITY)
Admission: RE | Admit: 2020-07-10 | Discharge: 2020-07-10 | Disposition: A | Discharge status: Home / Self Care | Payer: Commercial Managed Care - PPO | Source: Ambulatory Visit | Attending: Physician Assistant | Admitting: Physician Assistant

## 2020-07-10 DIAGNOSIS — R1011 Right upper quadrant pain: Secondary | ICD-10-CM | POA: Insufficient documentation

## 2020-07-10 MED ORDER — IOHEXOL 300 MG/ML  SOLN
100.0000 mL | Freq: Once | INTRAMUSCULAR | Status: AC | PRN
  Administered 2020-07-10: 100 mL via INTRAVENOUS

## 2020-07-15 ENCOUNTER — Ambulatory Visit: Payer: Medicaid Other | Admitting: Physician Assistant

## 2020-09-09 ENCOUNTER — Ambulatory Visit: Payer: Commercial Managed Care - PPO | Admitting: Obstetrics & Gynecology

## 2020-11-13 ENCOUNTER — Emergency Department (HOSPITAL_COMMUNITY): Payer: Commercial Managed Care - PPO

## 2020-11-13 ENCOUNTER — Emergency Department (HOSPITAL_COMMUNITY)
Admission: EM | Admit: 2020-11-13 | Discharge: 2020-11-13 | Disposition: A | Discharge status: Home / Self Care | Payer: Commercial Managed Care - PPO | Attending: Student | Admitting: Student

## 2020-11-13 ENCOUNTER — Encounter (HOSPITAL_COMMUNITY): Payer: Self-pay | Admitting: *Deleted

## 2020-11-13 DIAGNOSIS — R109 Unspecified abdominal pain: Secondary | ICD-10-CM | POA: Diagnosis not present

## 2020-11-13 DIAGNOSIS — R0789 Other chest pain: Secondary | ICD-10-CM | POA: Diagnosis not present

## 2020-11-13 DIAGNOSIS — R110 Nausea: Secondary | ICD-10-CM | POA: Insufficient documentation

## 2020-11-13 DIAGNOSIS — R002 Palpitations: Secondary | ICD-10-CM | POA: Insufficient documentation

## 2020-11-13 LAB — URINALYSIS, ROUTINE W REFLEX MICROSCOPIC
Bilirubin Urine: NEGATIVE
Glucose, UA: NEGATIVE mg/dL
Hgb urine dipstick: NEGATIVE
Ketones, ur: 80 mg/dL — AB
Leukocytes,Ua: NEGATIVE
Nitrite: NEGATIVE
Protein, ur: NEGATIVE mg/dL
Specific Gravity, Urine: 1.012 (ref 1.005–1.030)
pH: 6 (ref 5.0–8.0)

## 2020-11-13 LAB — COMPREHENSIVE METABOLIC PANEL
ALT: 22 U/L (ref 0–44)
AST: 21 U/L (ref 15–41)
Albumin: 4.5 g/dL (ref 3.5–5.0)
Alkaline Phosphatase: 118 U/L (ref 38–126)
Anion gap: 7 (ref 5–15)
BUN: 8 mg/dL (ref 6–20)
CO2: 24 mmol/L (ref 22–32)
Calcium: 9.2 mg/dL (ref 8.9–10.3)
Chloride: 105 mmol/L (ref 98–111)
Creatinine, Ser: 0.54 mg/dL (ref 0.44–1.00)
GFR, Estimated: >60 mL/min (ref >60)
Glucose, Bld: 90 mg/dL (ref 70–99)
Potassium: 3.8 mmol/L (ref 3.5–5.1)
Sodium: 136 mmol/L (ref 135–145)
Total Bilirubin: 0.5 mg/dL (ref 0.3–1.2)
Total Protein: 8.5 g/dL — ABNORMAL HIGH (ref 6.5–8.1)

## 2020-11-13 LAB — CBC
HCT: 43.1 % (ref 36.0–46.0)
Hemoglobin: 14.1 g/dL (ref 12.0–15.0)
MCH: 30.8 pg (ref 26.0–34.0)
MCHC: 32.7 g/dL (ref 30.0–36.0)
MCV: 94.1 fL (ref 80.0–100.0)
Platelets: 294 10*3/uL (ref 150–400)
RBC: 4.58 MIL/uL (ref 3.87–5.11)
RDW: 13.2 % (ref 11.5–15.5)
WBC: 8 10*3/uL (ref 4.0–10.5)
nRBC: 0 % (ref 0.0–0.2)

## 2020-11-13 LAB — LIPASE, BLOOD: Lipase: 33 U/L (ref 11–51)

## 2020-11-13 LAB — PREGNANCY, URINE: Preg Test, Ur: NEGATIVE

## 2020-11-13 LAB — TROPONIN I (HIGH SENSITIVITY)
Troponin I (High Sensitivity): <2 ng/L (ref <18)
Troponin I (High Sensitivity): 2 ng/L (ref <18)

## 2020-11-13 MED ORDER — HYDROXYZINE HCL 25 MG PO TABS: 25.0000 mg | ORAL_TABLET | Freq: Four times a day (QID) | ORAL | 0 refills | Status: DC

## 2020-11-13 NOTE — Discharge Instructions (Addendum)
You are seen in the emergency department for evaluation of chest pain and feelings of panic.  Your blood work and x-ray were reassuringly normal today.  A prescription for Atarax been sent to your pharmacy which treats anxiety.  Please take this medication only when you are feeling the rapid heart rate you described.  If you are still having chest pain after trying this medication, please call your primary care doctor to determine whether you should see a cardiologist in the outpatient or return to the ER.  If you have significant worsening of the chest pain, please return to the emergency department.

## 2020-11-13 NOTE — ED Provider Notes (Signed)
[facility] EMERGENCY DEPARTMENT Provider Note   CSN: [id number] Arrival date & time: 11/13/20  1207     History Chief Complaint  Patient presents with   Panic Attack   Chest Pain    Brandi Wilson is a 35 y.o. female with PMH thyroid disease who presents the emergency department for evaluation of multiple complaints including palpitations, chest pain, nausea, abdominal pain.  She states that the symptoms began after having a significant argument with her spouse 6 days ago and she states that she "has felt weird" since then.  Upon presentation today, she currently denies active chest pain, shortness of breath, abdominal pain, nausea vomiting or other systemic symptoms.   Chest Pain Associated symptoms: abdominal pain   Associated symptoms: no back pain, no cough, no fever, no palpitations, no shortness of breath and no vomiting       Past Medical History:  Diagnosis Date   Thyroid disease     Patient Active Problem List   Diagnosis Date Noted   Obesity 05/26/2015   Cephalalgia 05/22/2015   Elevated liver enzymes 05/22/2015    History reviewed. No pertinent surgical history.   OB History     Gravida  2   Para  2   Term  2   Preterm      AB      Living  2      SAB      IAB      Ectopic      Multiple      Live Births              Family History  Problem Relation Age of Onset   Cancer Mother     Social History   Tobacco Use   Smoking status: Never   Smokeless tobacco: Never  Vaping Use   Vaping Use: Never used  Substance Use Topics   Alcohol use: No   Drug use: No    Home Medications Prior to Admission medications   Medication Sig Start Date End Date Taking? Authorizing Provider  hydrOXYzine (ATARAX/VISTARIL) 25 MG tablet Take 1 tablet (25 mg total) by mouth every 6 (six) hours. 11/13/20  Yes Tyronza Happe, MD  acyclovir (ZOVIRAX) 400 MG tablet Take 1 tablet by mouth twice daily  Tome una tableta por boca dos veces  diarias Patient not taking: Reported on 06/10/2020 07/20/19   Jacquelin Hawking, PA-C  BIOTIN PO Take 1 tablet by mouth daily. Patient not taking: Reported on 06/10/2020    [provider]  gabapentin (NEURONTIN) 300 MG capsule Take 1 capsule (300 mg total) by mouth 3 (three) times daily. 06/10/20   Lazaro Arms, MD  levothyroxine (SYNTHROID) 25 MCG tablet Take 1 tablet (25 mcg total) by mouth daily before breakfast. Tome una tableta por boca diaria 03/24/20   Jacquelin Hawking, PA-C  medroxyPROGESTERone (DEPO-PROVERA) 150 MG/ML injection Inject 150 mg into the muscle every 3 (three) months.    [provider]  Omega-3 Fatty Acids (FISH OIL PO) Take 1 capsule by mouth daily. Patient not taking: Reported on 06/10/2020    [provider]  propranolol (INDERAL) 10 MG tablet 1 po bid for headache prevention.  Tome una tableta por [employer] veces diarias para prevencion de dolor de cabeza 03/24/20   Jacquelin Hawking, PA-C    Allergies    Patient has no known allergies.  Review of Systems   Review of Systems  Constitutional:  Negative for chills and fever.  HENT:  Negative for ear pain and sore throat.   Eyes:  Negative for pain and visual disturbance.  Respiratory:  Negative for cough and shortness of breath.   Cardiovascular:  Positive for chest pain. Negative for palpitations.  Gastrointestinal:  Positive for abdominal pain. Negative for vomiting.  Genitourinary:  Negative for dysuria and hematuria.  Musculoskeletal:  Negative for arthralgias and back pain.  Skin:  Negative for color change and rash.  Neurological:  Negative for seizures and syncope.  All other systems reviewed and are negative.  Physical Exam Updated Vital Signs BP 122/77   Pulse 77   Temp 98.1 F (36.7 C) (Oral)   Resp 16   Ht 4\' 11"  (1.499 m)   Wt 79.8 kg   SpO2 98%   BMI 35.53 kg/m   Physical Exam Vitals and nursing note reviewed.  Constitutional:      General: She is not in acute  distress.    Appearance: She is well-developed.  HENT:     Head: Normocephalic and atraumatic.  Eyes:     Conjunctiva/sclera: Conjunctivae normal.  Cardiovascular:     Rate and Rhythm: Normal rate and regular rhythm.     Heart sounds: No murmur heard. Pulmonary:     Effort: Pulmonary effort is normal. No respiratory distress.     Breath sounds: Normal breath sounds.  Abdominal:     Palpations: Abdomen is soft.     Tenderness: There is no abdominal tenderness.  Musculoskeletal:     Cervical back: Neck supple.  Skin:    General: Skin is warm and dry.  Neurological:     Mental Status: She is alert.    ED Results / Procedures / Treatments   Labs (all labs ordered are listed, but only abnormal results are displayed) Labs Reviewed  COMPREHENSIVE METABOLIC PANEL - Abnormal; Notable for the following components:      Result Value   Total Protein 8.5 (*)    All other components within normal limits  URINALYSIS, ROUTINE W REFLEX MICROSCOPIC - Abnormal; Notable for the following components:   Ketones, ur 80 (*)    All other components within normal limits  LIPASE, BLOOD  CBC  PREGNANCY, URINE  TROPONIN I (HIGH SENSITIVITY)  TROPONIN I (HIGH SENSITIVITY)    EKG EKG Interpretation  Date/Time:  Thursday November 13 2020 15:09:00 EDT Ventricular Rate:  74 PR Interval:  158 QRS Duration: 84 QT Interval:  376 QTC Calculation: 417 R Axis:   90 Text Interpretation: Normal sinus rhythm with sinus arrhythmia Confirmed by Jedaiah Rathbun ([id number]) on 11/13/2020 11:57:44 PM  Radiology DG Chest 2 View  Result Date: 11/13/2020 CLINICAL DATA:  Left-sided chest pain for a few days. EXAM: CHEST - 2 VIEW COMPARISON:  None. FINDINGS: The patient is mildly rotated to the right. The cardiac silhouette is borderline enlarged. The lungs are slightly hypoinflated. No confluent airspace opacity, edema, pleural effusion, pneumothorax is identified. No acute osseous abnormality is seen. IMPRESSION: No  active cardiopulmonary disease. Electronically Signed   By: 01/13/2021 M.D.   On: 11/13/2020 16:13    Procedures Procedures   Medications Ordered in ED Medications - No data to display  ED Course  I have reviewed the triage vital signs and the nursing notes.  Pertinent labs & imaging results that were available during my care of the patient were reviewed by me and considered in my medical decision making (see chart for details).    MDM Rules/Calculators/A&P  Patient seen emergency department for evaluation of multiple complaints as described above.  Physical exam is unremarkable.  Laboratory evaluation is unremarkable including troponin and negative delta troponin.  ECG nonischemic, chest x-ray unremarkable.  Heart score of 1.  Low suspicion for ACS, low suspicion for PE at this time with no hypoxia or active shortness of breath.  Patient safe for discharge at this time and she was discharged with a prescription for hydroxyzine. Final Clinical Impression(s) / ED Diagnoses Final diagnoses:  Atypical chest pain    Rx / DC Orders ED Discharge Orders          Ordered    hydrOXYzine (ATARAX/VISTARIL) 25 MG tablet  Every 6 hours        11/13/20 1731             Zuly Belkin, [place], MD 11/14/20 0002

## 2020-11-13 NOTE — ED Triage Notes (Signed)
Unable to complete triage at this time because video interpreter is being used in another room

## 2020-11-13 NOTE — ED Notes (Signed)
Interpreter used for clarification of what brought pt to ED. Pt reports nausea/cp/headache since Saturday. Pt struggling to understand interpreter questions. Pt does not appear to be in acute distress currently

## 2020-11-13 NOTE — ED Triage Notes (Signed)
Panic attack onset 6 days ago. States her chest has been hurting ever since

## 2021-11-04 ENCOUNTER — Encounter: Payer: Self-pay | Admitting: Emergency Medicine

## 2021-11-04 ENCOUNTER — Other Ambulatory Visit: Payer: Self-pay

## 2021-11-04 ENCOUNTER — Ambulatory Visit
Admission: EM | Admit: 2021-11-04 | Discharge: 2021-11-04 | Disposition: A | Discharge status: Home / Self Care | Payer: Commercial Managed Care - PPO | Attending: Nurse Practitioner | Admitting: Nurse Practitioner

## 2021-11-04 DIAGNOSIS — K644 Residual hemorrhoidal skin tags: Secondary | ICD-10-CM | POA: Diagnosis not present

## 2021-11-04 MED ORDER — HYDROCORTISONE ACETATE 25 MG RE SUPP: 25.0000 mg | Freq: Two times a day (BID) | RECTAL | 0 refills | Status: DC

## 2021-11-04 NOTE — ED Provider Notes (Signed)
[facility]    CSN: [id number] Arrival date & time: 11/04/21  1005      History   Chief Complaint Chief Complaint  Patient presents with   Rectal Bleeding    HPI Brandi Wilson is a 36 y.o. female.   The history is provided by the patient. Language interpreter used: Declined use of interpreter services. Matilde Sprang, CMA present.   Patient presents for complaints of rectal bleeding that been present for the past "several days".  Patient states that she has noticed bright red blood with bowel movements only.  She also complains of chills and intermittent headache.  She denies fever, abdominal pain, nausea, vomiting, diarrhea, rectal pain, tarry stools, bloody stools, or urinary symptoms.  Patient denies previous history of hemorrhoids.    Past Medical History:  Diagnosis Date   Thyroid disease     Patient Active Problem List   Diagnosis Date Noted   Obesity 05/26/2015   Cephalalgia 05/22/2015   Elevated liver enzymes 05/22/2015    History reviewed. No pertinent surgical history.  OB History     Gravida  2   Para  2   Term  2   Preterm      AB      Living  2      SAB      IAB      Ectopic      Multiple      Live Births               Home Medications    Prior to Admission medications   Medication Sig Start Date End Date Taking? Authorizing Provider  hydrocortisone (ANUSOL-HC) 25 MG suppository Place 1 suppository (25 mg total) rectally 2 (two) times daily. 11/04/21  Yes Mykeal Carrick-Warren, Sadie Haber, NP  acyclovir (ZOVIRAX) 400 MG tablet Take 1 tablet by mouth twice daily  Tome una tableta por boca dos veces diarias Patient not taking: Reported on 06/10/2020 07/20/19   Jacquelin Hawking, PA-C  BIOTIN PO Take 1 tablet by mouth daily. Patient not taking: Reported on 06/10/2020    [provider]  gabapentin (NEURONTIN) 300 MG capsule Take 1 capsule (300 mg total) by mouth 3 (three) times daily. 06/10/20   Lazaro Arms, MD   hydrOXYzine (ATARAX/VISTARIL) 25 MG tablet Take 1 tablet (25 mg total) by mouth every 6 (six) hours. 11/13/20   Kommor, [place], MD  levothyroxine (SYNTHROID) 25 MCG tablet Take 1 tablet (25 mcg total) by mouth daily before breakfast. Tome una tableta por boca diaria 03/24/20   Jacquelin Hawking, PA-C  medroxyPROGESTERone (DEPO-PROVERA) 150 MG/ML injection Inject 150 mg into the muscle every 3 (three) months.    [provider]  Omega-3 Fatty Acids (FISH OIL PO) Take 1 capsule by mouth daily. Patient not taking: Reported on 06/10/2020    [provider]  propranolol (INDERAL) 10 MG tablet 1 po bid for headache prevention.  Tome una tableta por [employer] veces diarias para prevencion de dolor de cabeza 03/24/20   Jacquelin Hawking, PA-C    Family History Family History  Problem Relation Age of Onset   Cancer Mother     Social History Social History   Tobacco Use   Smoking status: Never   Smokeless tobacco: Never  Vaping Use   Vaping Use: Never used  Substance Use Topics   Alcohol use: No   Drug use: No     Allergies   Patient has no known allergies.   Review  of Systems Review of Systems Per HPI  Physical Exam Triage Vital Signs ED Triage Vitals  Enc Vitals Group     BP 11/04/21 1052 (!) 104/57     Pulse Rate 11/04/21 1052 72     Resp 11/04/21 1052 20     Temp 11/04/21 1052 99 F (37.2 C)     Temp Source 11/04/21 1052 Oral     SpO2 11/04/21 1052 98 %     Weight --      Height --      Head Circumference --      Peak Flow --      Pain Score 11/04/21 1054 0     Pain Loc --      Pain Edu? --      Excl. in [place]? --    No data found.  Updated Vital Signs BP (!) 104/57 (BP Location: Right Arm)   Pulse 72   Temp 99 F (37.2 C) (Oral)   Resp 20   SpO2 98%   Visual Acuity Right Eye Distance:   Left Eye Distance:   Bilateral Distance:    Right Eye Near:   Left Eye Near:    Bilateral Near:     Physical Exam Vitals and nursing note reviewed.   Constitutional:      General: She is not in acute distress.    Appearance: Normal appearance.  HENT:     Head: Normocephalic.  Cardiovascular:     Rate and Rhythm: Normal rate and regular rhythm.     Pulses: Normal pulses.     Heart sounds: Normal heart sounds.  Pulmonary:     Effort: Pulmonary effort is normal.     Breath sounds: Normal breath sounds.  Abdominal:     General: Bowel sounds are normal. There is no distension.     Palpations: Abdomen is soft. There is no mass.     Tenderness: There is no abdominal tenderness. There is no right CVA tenderness, left CVA tenderness or rebound.     Hernia: No hernia is present.  Genitourinary:    Rectum: External hemorrhoid present. No tenderness. Normal anal tone.     Comments: No bleeding present on exam. External hemorrhoid present.  Skin:    General: Skin is warm.  Neurological:     General: No focal deficit present.     Mental Status: She is alert.  Psychiatric:        Mood and Affect: Mood normal.        Behavior: Behavior normal.      UC Treatments / Results  Labs (all labs ordered are listed, but only abnormal results are displayed) Labs Reviewed - No data to display  EKG   Radiology No results found.  Procedures Procedures (including critical care time)  Medications Ordered in UC Medications - No data to display  Initial Impression / Assessment and Plan / UC Course  I have reviewed the triage vital signs and the nursing notes.  Pertinent labs & imaging results that were available during my care of the patient were reviewed by me and considered in my medical decision making (see chart for details).  She presents for complaints of rectal bleeding for the past "several days".  On exam, patient has an external hemorrhoid that is without bleeding.  There is no active rectal bleeding on exam.  Hemorrhoid does not appear to be thrombosed.  Patient's vital signs are stable, she is in no acute distress.  Active  bleeding most  likely caused by the hemorrhoid.  Will start patient on Anusol suppositories for her symptoms.  Supportive care recommendations were given to the patient along with strict indications of when to go to the emergency department.  Patient was advised that if symptoms fail to improve, recommend following up with gastroenterologist. Final Clinical Impressions(s) / UC Diagnoses   Final diagnoses:  External hemorrhoid     Discharge Instructions      Use medication as prescribed. Limit time spent on toilet and avoid straining.  Use stool softeners or laxatives to increase fecal volume and soften stools. Increase fluids. Try to drink at least 8 to 10  8 oz glasses of water daily. Modify diet (increase fiber and water intake, reduce fat intake). Participate in regular physical activity. Avoid medication that causes constipation or diarrhea. Go to the emergency department immediately if you have worsening rectal bleeding such that it will not stop, rectal pain, or inability to have a bowel movement. Recommend following up with gastroenterology for continued symptoms.     ED Prescriptions     Medication Sig Dispense Auth. Provider   hydrocortisone (ANUSOL-HC) 25 MG suppository Place 1 suppository (25 mg total) rectally 2 (two) times daily. 12 suppository Sampson Self-Warren, Sadie Haber, NP      PDMP not reviewed this encounter.   Abran Cantor, NP 11/04/21 1222

## 2021-11-04 NOTE — Discharge Instructions (Addendum)
Use medication as prescribed. Limit time spent on toilet and avoid straining.  Use stool softeners or laxatives to increase fecal volume and soften stools. Increase fluids. Try to drink at least 8 to 10  8 oz glasses of water daily. Modify diet (increase fiber and water intake, reduce fat intake). Participate in regular physical activity. Avoid medication that causes constipation or diarrhea. Go to the emergency department immediately if you have worsening rectal bleeding such that it will not stop, rectal pain, or inability to have a bowel movement. Recommend following up with gastroenterology for continued symptoms.

## 2021-11-04 NOTE — ED Triage Notes (Signed)
Pt reports bright red blood with BM,chills, and headache for last several days.

## 2021-12-31 ENCOUNTER — Ambulatory Visit
Admission: EM | Admit: 2021-12-31 | Discharge: 2021-12-31 | Disposition: A | Discharge status: Home / Self Care | Payer: Commercial Managed Care - PPO | Attending: Nurse Practitioner | Admitting: Nurse Practitioner

## 2021-12-31 DIAGNOSIS — J309 Allergic rhinitis, unspecified: Secondary | ICD-10-CM

## 2021-12-31 DIAGNOSIS — B349 Viral infection, unspecified: Secondary | ICD-10-CM

## 2021-12-31 DIAGNOSIS — R6889 Other general symptoms and signs: Secondary | ICD-10-CM

## 2021-12-31 MED ORDER — CETIRIZINE HCL 10 MG PO TABS: 10.0000 mg | ORAL_TABLET | Freq: Every day | ORAL | 0 refills | Status: DC

## 2021-12-31 NOTE — ED Provider Notes (Signed)
[facility]    CSN: [id number] Arrival date & time: 12/31/21  1103      History   Chief Complaint No chief complaint on file.   HPI Brandi Wilson is a 36 y.o. female.   The history is provided by the patient. A language interpreter was used.   Patient presents for a 1 week history of intermittent fever, scratchy throat, headache, body aches.  She has not been checking her fever at home, but states that she has episodes of "feeling hot and sweating each morning".  She denies ear pain, cough, nasal congestion, runny nose, abdominal pain, nausea, vomiting, or diarrhea.  She has been using Aleve, Advil, Tylenol, and hot tea for her symptoms with some relief.  She denies any known sick contacts.  She states that she has not been vaccinated for influenza or COVID.  Past Medical History:  Diagnosis Date   Thyroid disease     Patient Active Problem List   Diagnosis Date Noted   Obesity 05/26/2015   Cephalalgia 05/22/2015   Elevated liver enzymes 05/22/2015    History reviewed. No pertinent surgical history.  OB History     Gravida  2   Para  2   Term  2   Preterm      AB      Living  2      SAB      IAB      Ectopic      Multiple      Live Births               Home Medications    Prior to Admission medications   Medication Sig Start Date End Date Taking? Authorizing Provider  cetirizine (ZYRTEC) 10 MG tablet Take 1 tablet (10 mg total) by mouth daily. 12/31/21  Yes Chikita Dogan-Warren, Sadie Haber, NP  acyclovir (ZOVIRAX) 400 MG tablet Take 1 tablet by mouth twice daily  Tome una tableta por boca dos veces diarias Patient not taking: Reported on 06/10/2020 07/20/19   Jacquelin Hawking, PA-C  BIOTIN PO Take 1 tablet by mouth daily. Patient not taking: Reported on 06/10/2020    [provider]  gabapentin (NEURONTIN) 300 MG capsule Take 1 capsule (300 mg total) by mouth 3 (three) times daily. 06/10/20   Lazaro Arms, MD   hydrocortisone (ANUSOL-HC) 25 MG suppository Place 1 suppository (25 mg total) rectally 2 (two) times daily. 11/04/21   Monifa Blanchette-Warren, Sadie Haber, NP  hydrOXYzine (ATARAX/VISTARIL) 25 MG tablet Take 1 tablet (25 mg total) by mouth every 6 (six) hours. 11/13/20   Kommor, [place], MD  levothyroxine (SYNTHROID) 25 MCG tablet Take 1 tablet (25 mcg total) by mouth daily before breakfast. Tome una tableta por boca diaria 03/24/20   Jacquelin Hawking, PA-C  medroxyPROGESTERone (DEPO-PROVERA) 150 MG/ML injection Inject 150 mg into the muscle every 3 (three) months.    [provider]  Omega-3 Fatty Acids (FISH OIL PO) Take 1 capsule by mouth daily. Patient not taking: Reported on 06/10/2020    [provider]  propranolol (INDERAL) 10 MG tablet 1 po bid for headache prevention.  Tome una tableta por [employer] veces diarias para prevencion de dolor de cabeza 03/24/20   Jacquelin Hawking, PA-C    Family History Family History  Problem Relation Age of Onset   Cancer Mother     Social History Social History   Tobacco Use   Smoking status: Never   Smokeless tobacco: Never  Vaping  Use   Vaping Use: Never used  Substance Use Topics   Alcohol use: No   Drug use: Never     Allergies   Patient has no known allergies.   Review of Systems Review of Systems Per HPI  Physical Exam Triage Vital Signs ED Triage Vitals  Enc Vitals Group     BP 12/31/21 1124 106/73     Pulse Rate 12/31/21 1124 76     Resp 12/31/21 1124 16     Temp 12/31/21 1124 99.2 F (37.3 C)     Temp Source 12/31/21 1124 Oral     SpO2 12/31/21 1124 98 %     Weight --      Height --      Head Circumference --      Peak Flow --      Pain Score 12/31/21 1125 0     Pain Loc --      Pain Edu? --      Excl. in [place]? --    No data found.  Updated Vital Signs BP 106/73 (BP Location: Right Arm)   Pulse 76   Temp 99.2 F (37.3 C) (Oral)   Resp 16   SpO2 98%   Visual Acuity Right Eye Distance:   Left Eye  Distance:   Bilateral Distance:    Right Eye Near:   Left Eye Near:    Bilateral Near:     Physical Exam Vitals and nursing note reviewed.  Constitutional:      General: She is not in acute distress.    Appearance: She is well-developed.  HENT:     Head: Normocephalic.     Right Ear: Tympanic membrane, ear canal and external ear normal.     Left Ear: Tympanic membrane, ear canal and external ear normal.     Nose: Nose normal.     Right Turbinates: Not enlarged or swollen.     Left Turbinates: Not enlarged or swollen.     Right Sinus: No maxillary sinus tenderness or frontal sinus tenderness.     Left Sinus: No maxillary sinus tenderness or frontal sinus tenderness.     Mouth/Throat:     Lips: Pink.     Mouth: Mucous membranes are moist.     Pharynx: Uvula midline. Posterior oropharyngeal erythema present. No pharyngeal swelling, oropharyngeal exudate or uvula swelling.     Tonsils: No tonsillar exudate.     Comments: Cobblestoning present on posterior tongue Eyes:     Extraocular Movements: Extraocular movements intact.     Conjunctiva/sclera: Conjunctivae normal.     Pupils: Pupils are equal, round, and reactive to light.  Cardiovascular:     Rate and Rhythm: Normal rate and regular rhythm.     Heart sounds: Normal heart sounds.  Pulmonary:     Effort: Pulmonary effort is normal.     Breath sounds: Normal breath sounds.  Abdominal:     General: Bowel sounds are normal. There is no distension.     Palpations: Abdomen is soft.     Tenderness: There is no abdominal tenderness. There is no guarding or rebound.  Genitourinary:    Vagina: Normal. No vaginal discharge.  Musculoskeletal:     Cervical back: Normal range of motion.  Lymphadenopathy:     Cervical: No cervical adenopathy.  Skin:    General: Skin is warm and dry.     Findings: No erythema or rash.  Neurological:     General: No focal deficit present.  Mental Status: She is alert and oriented to person,  place, and time.     Cranial Nerves: No cranial nerve deficit.  Psychiatric:        Mood and Affect: Mood normal.        Behavior: Behavior normal.      UC Treatments / Results  Labs (all labs ordered are listed, but only abnormal results are displayed) Labs Reviewed - No data to display  EKG   Radiology No results found.  Procedures Procedures (including critical care time)  Medications Ordered in UC Medications - No data to display  Initial Impression / Assessment and Plan / UC Course  I have reviewed the triage vital signs and the nursing notes.  Pertinent labs & imaging results that were available during my care of the patient were reviewed by me and considered in my medical decision making (see chart for details).  Patient presents with a 1 week history of viral symptoms.  Patient is well-appearing, is in no acute distress, vital signs are stable.  Based on the patient's symptoms, duration of symptoms, symptoms are consistent with viral illness associated with flulike symptoms.  On exam, patient also had cobblestoning on her posterior tongue consistent with allergic rhinitis.  COVID/flu testing was not indicated based on the duration of the patient's symptoms, nor would this change the treatment plan.  Discussed with patient that viral symptoms can last up to 14 days.  Recommend that she continue over-the-counter medications such as Tylenol, ibuprofen, and Aleve.  Patient was prescribed cetirizine 10 mg tablets for her allergy symptoms.  Supportive care recommendations were provided to the patient to include increasing malaise and allow for plenty of rest.  Patient was advised to follow-up in this clinic for worsening symptoms or sooner as needed.  Patient verbalizes understanding.  All questions were answered.  Patient is stable for discharge. Final Clinical Impressions(s) / UC Diagnoses   Final diagnoses:  Viral illness  Influenza-like symptoms  Allergic rhinitis,  unspecified seasonality, unspecified trigger     Discharge Instructions      Take medication as prescribed. Increase fluids and allow for plenty of rest. Continue Tylenol or ibuprofen as needed for pain, fever, or general discomfort.  Please take medication every 8 hours with food and water while symptoms persist. Warm salt water gargles 3-4 times daily to help with throat discomfort. As discussed, if symptoms do not improve over the next 10 days, please follow-up in our clinic or sooner if you develop worsening fever, nasal congestion, runny nose, wheezing, or shortness of breath. Follow-up as needed.     ED Prescriptions     Medication Sig Dispense Auth. Provider   cetirizine (ZYRTEC) 10 MG tablet Take 1 tablet (10 mg total) by mouth daily. 30 tablet Jodiann Ognibene-Warren, Sadie Haber, NP      PDMP not reviewed this encounter.   Abran Cantor, NP 12/31/21 1149

## 2021-12-31 NOTE — Discharge Instructions (Addendum)
Take medication as prescribed. Increase fluids and allow for plenty of rest. Continue Tylenol or ibuprofen as needed for pain, fever, or general discomfort.  Please take medication every 8 hours with food and water while symptoms persist. Warm salt water gargles 3-4 times daily to help with throat discomfort. As discussed, if symptoms do not improve over the next 10 days, please follow-up in our clinic or sooner if you develop worsening fever, nasal congestion, runny nose, wheezing, or shortness of breath. Follow-up as needed.

## 2021-12-31 NOTE — ED Triage Notes (Signed)
Pt reports fever, body aches, headache, scratchy throat x 1 week. Aleve, Advil, Tylenol and hot tea gives some relief

## 2022-04-17 ENCOUNTER — Ambulatory Visit
Admission: EM | Admit: 2022-04-17 | Discharge: 2022-04-17 | Disposition: A | Discharge status: Home / Self Care | Payer: Commercial Managed Care - PPO | Attending: Nurse Practitioner | Admitting: Nurse Practitioner

## 2022-04-17 ENCOUNTER — Encounter: Payer: Self-pay | Admitting: Emergency Medicine

## 2022-04-17 DIAGNOSIS — Z1152 Encounter for screening for COVID-19: Secondary | ICD-10-CM | POA: Diagnosis present

## 2022-04-17 DIAGNOSIS — B349 Viral infection, unspecified: Secondary | ICD-10-CM | POA: Diagnosis present

## 2022-04-17 DIAGNOSIS — R110 Nausea: Secondary | ICD-10-CM | POA: Insufficient documentation

## 2022-04-17 MED ORDER — ONDANSETRON HCL 4 MG PO TABS: 4.0000 mg | ORAL_TABLET | Freq: Three times a day (TID) | ORAL | 0 refills | Status: DC | PRN

## 2022-04-17 NOTE — Discharge Instructions (Addendum)
COVID test is pending.  You will be contacted if the pending test result is positive.  You are able to receive antiviral therapy if your test is positive. Take medication as prescribed. Increase fluids and allow for plenty of rest. Continue Tylenol or ibuprofen as needed for pain, fever, or general discomfort. Recommend a brat diet this includes bananas, rice, applesauce, and toast while your nausea is persistent. Please be advised that a viral illness can last from 10 to 14 days.  If symptoms suddenly worsen before that time or extend beyond that time, please follow-up in this clinic or with your primary care physician for further evaluation. Follow-up as needed.

## 2022-04-17 NOTE — ED Provider Notes (Signed)
[facility]    CSN: [id number] Arrival date & time: 04/17/22  0906      History   Chief Complaint No chief complaint on file.   HPI Brandi Wilson is a 37 y.o. female.   The history is provided by the patient. A language interpreter was used Lowella Dandy [phone]).   Presents for complaints of headache, fever, and nausea.  Symptoms started over the past several days.  Patient denies chills, sore throat, ear pain, nasal congestion, runny nose, cough, abdominal pain, vomiting, or diarrhea.  She reports she has been taking Tylenol for her symptoms.  She denies any known sick contacts.  Past Medical History:  Diagnosis Date   Thyroid disease     Patient Active Problem List   Diagnosis Date Noted   Obesity 05/26/2015   Cephalalgia 05/22/2015   Elevated liver enzymes 05/22/2015    History reviewed. No pertinent surgical history.  OB History     Gravida  2   Para  2   Term  2   Preterm      AB      Living  2      SAB      IAB      Ectopic      Multiple      Live Births               Home Medications    Prior to Admission medications   Medication Sig Start Date End Date Taking? Authorizing Provider  ondansetron (ZOFRAN) 4 MG tablet Take 1 tablet (4 mg total) by mouth every 8 (eight) hours as needed for nausea or vomiting. 04/17/22  Yes Maddyx Vallie-Warren, Alda Lea, NP  acyclovir (ZOVIRAX) 400 MG tablet Take 1 tablet by mouth twice daily  Tome una tableta por boca dos veces diarias Patient not taking: Reported on 06/10/2020 07/20/19   Soyla Dryer, PA-C  BIOTIN PO Take 1 tablet by mouth daily. Patient not taking: Reported on 06/10/2020    [provider]  cetirizine (ZYRTEC) 10 MG tablet Take 1 tablet (10 mg total) by mouth daily. 12/31/21   Alaira Level-Warren, Alda Lea, NP  gabapentin (NEURONTIN) 300 MG capsule Take 1 capsule (300 mg total) by mouth 3 (three) times daily. 06/10/20   Florian Buff, MD  hydrocortisone (ANUSOL-HC)  25 MG suppository Place 1 suppository (25 mg total) rectally 2 (two) times daily. 11/04/21   Yoskar Murrillo-Warren, Alda Lea, NP  hydrOXYzine (ATARAX/VISTARIL) 25 MG tablet Take 1 tablet (25 mg total) by mouth every 6 (six) hours. 11/13/20   Kommor, Madison, MD  levothyroxine (SYNTHROID) 25 MCG tablet Take 1 tablet (25 mcg total) by mouth daily before breakfast. Tome una tableta por boca diaria 03/24/20   Soyla Dryer, PA-C  medroxyPROGESTERone (DEPO-PROVERA) 150 MG/ML injection Inject 150 mg into the muscle every 3 (three) months.    [provider]  Omega-3 Fatty Acids (FISH OIL PO) Take 1 capsule by mouth daily. Patient not taking: Reported on 06/10/2020    [provider]  propranolol (INDERAL) 10 MG tablet 1 po bid for headache prevention.  Tome una tableta por [employer] veces diarias para prevencion de dolor de cabeza 03/24/20   Soyla Dryer, PA-C    Family History Family History  Problem Relation Age of Onset   Cancer Mother     Social History Social History   Tobacco Use   Smoking status: Never   Smokeless tobacco: Never  Vaping Use   Vaping Use: Never  used  Substance Use Topics   Alcohol use: No   Drug use: Never     Allergies   Patient has no known allergies.   Review of Systems Review of Systems Per HPI  Physical Exam Triage Vital Signs ED Triage Vitals  Enc Vitals Group     BP 04/17/22 1106 115/79     Pulse Rate 04/17/22 1106 74     Resp 04/17/22 1106 18     Temp 04/17/22 1106 98.8 F (37.1 C)     Temp Source 04/17/22 1106 Oral     SpO2 04/17/22 1106 98 %     Weight --      Height --      Head Circumference --      Peak Flow --      Pain Score 04/17/22 1108 9     Pain Loc --      Pain Edu? --      Excl. in [place]? --    No data found.  Updated Vital Signs BP 115/79 (BP Location: Right Arm)   Pulse 74   Temp 98.8 F (37.1 C) (Oral)   Resp 18   SpO2 98%   Visual Acuity Right Eye Distance:   Left Eye Distance:   Bilateral  Distance:    Right Eye Near:   Left Eye Near:    Bilateral Near:     Physical Exam Vitals and nursing note reviewed.  Constitutional:      General: She is not in acute distress.    Appearance: Normal appearance.  HENT:     Head: Normocephalic.     Right Ear: Tympanic membrane, ear canal and external ear normal.     Left Ear: Tympanic membrane, ear canal and external ear normal.     Nose: Nose normal.     Mouth/Throat:     Mouth: Mucous membranes are moist.     Pharynx: No posterior oropharyngeal erythema.  Eyes:     Extraocular Movements: Extraocular movements intact.     Pupils: Pupils are equal, round, and reactive to light.  Cardiovascular:     Rate and Rhythm: Normal rate and regular rhythm.     Pulses: Normal pulses.     Heart sounds: Normal heart sounds.  Pulmonary:     Effort: Pulmonary effort is normal.     Breath sounds: Normal breath sounds.  Abdominal:     General: Bowel sounds are normal.     Palpations: Abdomen is soft.     Tenderness: There is no abdominal tenderness.  Musculoskeletal:     Cervical back: Normal range of motion.  Lymphadenopathy:     Cervical: No cervical adenopathy.  Skin:    General: Skin is warm and dry.  Neurological:     General: No focal deficit present.     Mental Status: She is alert and oriented to person, place, and time.  Psychiatric:        Mood and Affect: Mood normal.        Behavior: Behavior normal.      UC Treatments / Results  Labs (all labs ordered are listed, but only abnormal results are displayed) Labs Reviewed  SARS CORONAVIRUS 2 (TAT 6-24 HRS)    EKG   Radiology No results found.  Procedures Procedures (including critical care time)  Medications Ordered in UC Medications - No data to display  Initial Impression / Assessment and Plan / UC Course  I have reviewed the triage vital signs and the nursing  notes.  Pertinent labs & imaging results that were available during my care of the patient were  reviewed by me and considered in my medical decision making (see chart for details).  Patient is well-appearing, she is in no acute distress, vital signs are stable.  Suspect a viral illness at this time.  COVID test is pending, patient is a candidate to receive Paxlovid if her COVID test is positive.  Patient is currently afebrile, lung sounds are clear throughout, and she is in no acute distress.  Will provide symptomatic treatment for her nausea with Zofran 4 mg.  Supportive care recommendations were provided to the patient to include increasing fluids, allowing for plenty of rest, and continuing Tylenol or ibuprofen as needed for pain, fever, or general discomfort.  Discussed viral etiology with the patient and when follow-up may be indicated.  Patient verbalizes understanding.  All questions were answered.  Patient is stable for discharge.   Final Clinical Impressions(s) / UC Diagnoses   Final diagnoses:  Encounter for screening for COVID-19  Viral illness  Nausea without vomiting     Discharge Instructions      COVID test is pending.  You will be contacted if the pending test result is positive.  You are able to receive antiviral therapy if your test is positive. Take medication as prescribed. Increase fluids and allow for plenty of rest. Continue Tylenol or ibuprofen as needed for pain, fever, or general discomfort. Recommend a brat diet this includes bananas, rice, applesauce, and toast while your nausea is persistent. Please be advised that a viral illness can last from 10 to 14 days.  If symptoms suddenly worsen before that time or extend beyond that time, please follow-up in this clinic or with your primary care physician for further evaluation. Follow-up as needed.     ED Prescriptions     Medication Sig Dispense Auth. Provider   ondansetron (ZOFRAN) 4 MG tablet Take 1 tablet (4 mg total) by mouth every 8 (eight) hours as needed for nausea or vomiting. 20 tablet  Jamin Panther-Warren, Alda Lea, NP      PDMP not reviewed this encounter.   Tish Men, NP 04/17/22 1136

## 2022-04-17 NOTE — ED Triage Notes (Signed)
Headache, fever, nausea, since Wednesday.

## 2022-04-18 LAB — SARS CORONAVIRUS 2 (TAT 6-24 HRS): SARS Coronavirus 2: NEGATIVE

## 2023-01-23 IMAGING — DX DG CHEST 2V
2 series · 2 of 2 positions shown · non-contrast
Comparison: None.

CLINICAL DATA: Left-sided chest pain for a few days.

EXAM:
CHEST - 2 VIEW

[chest pa]
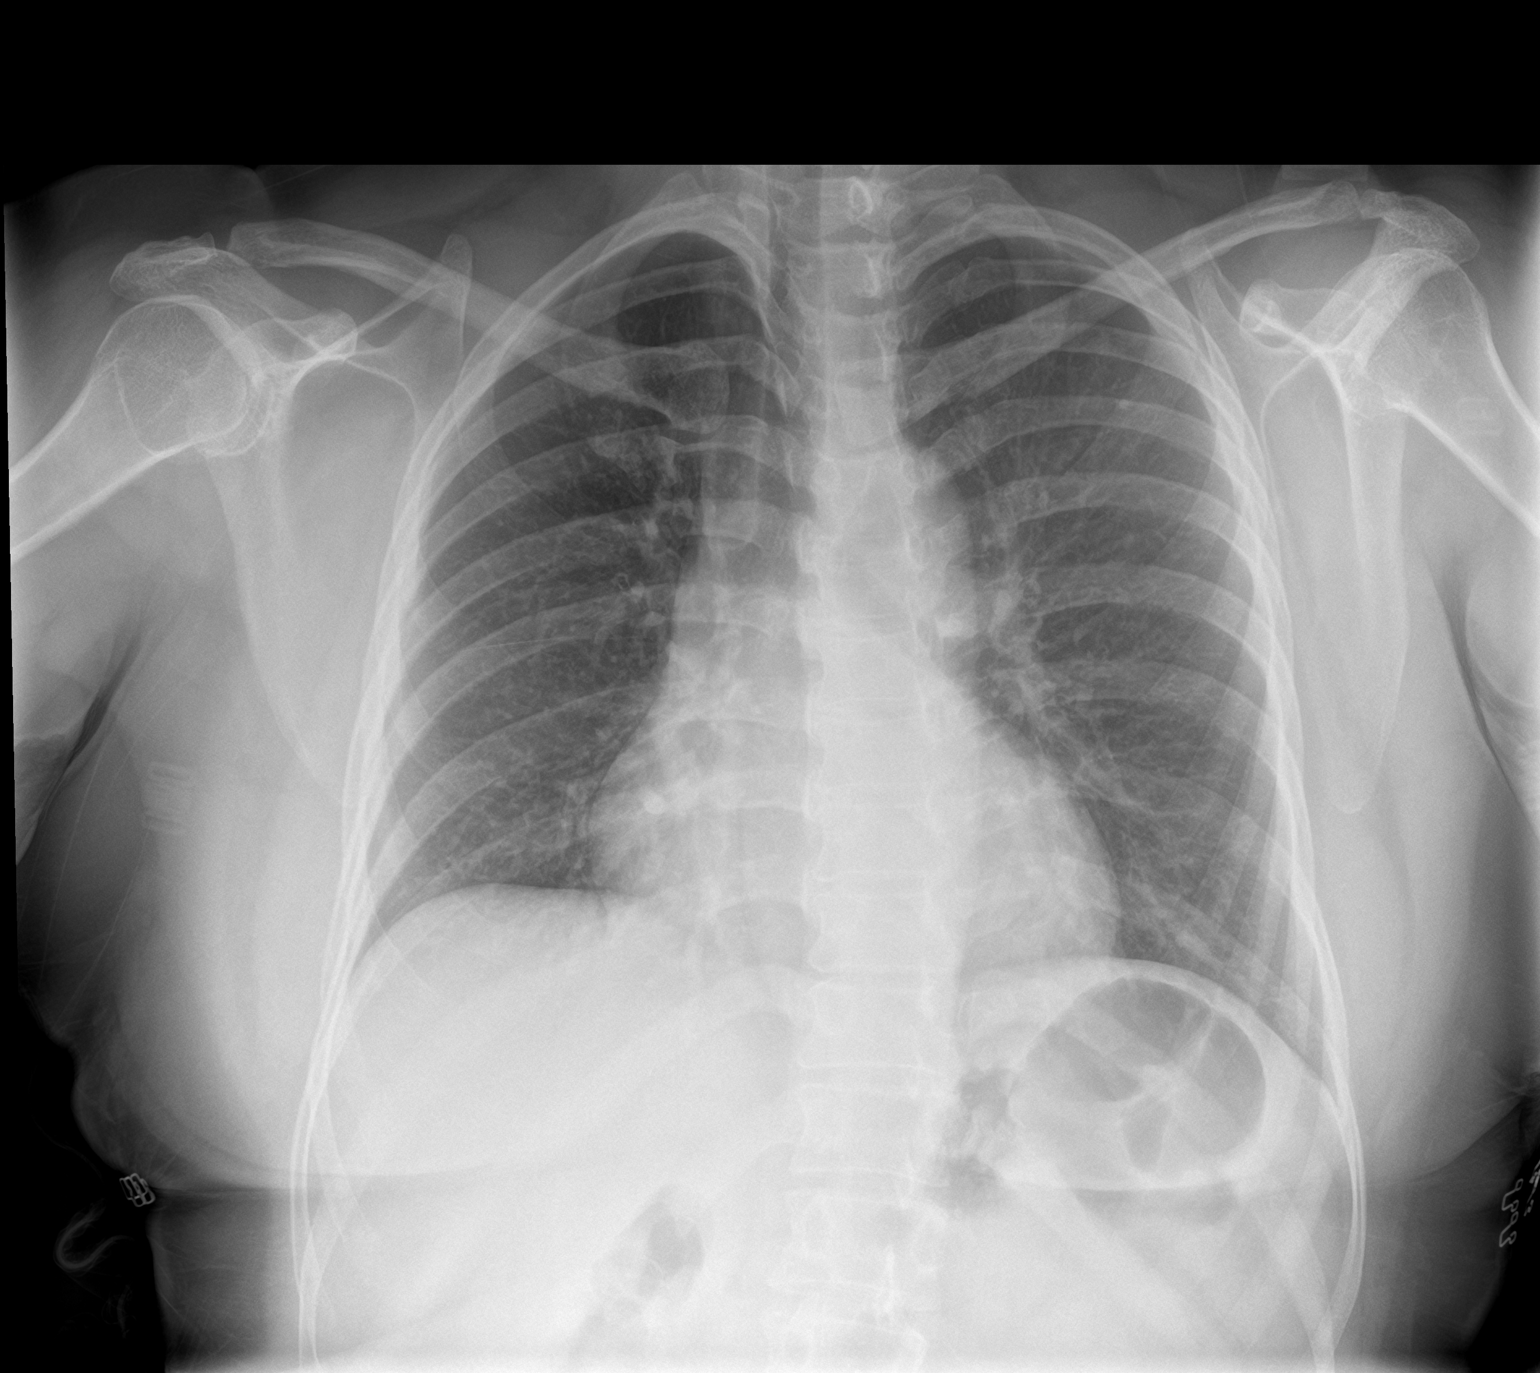

[chest lat]
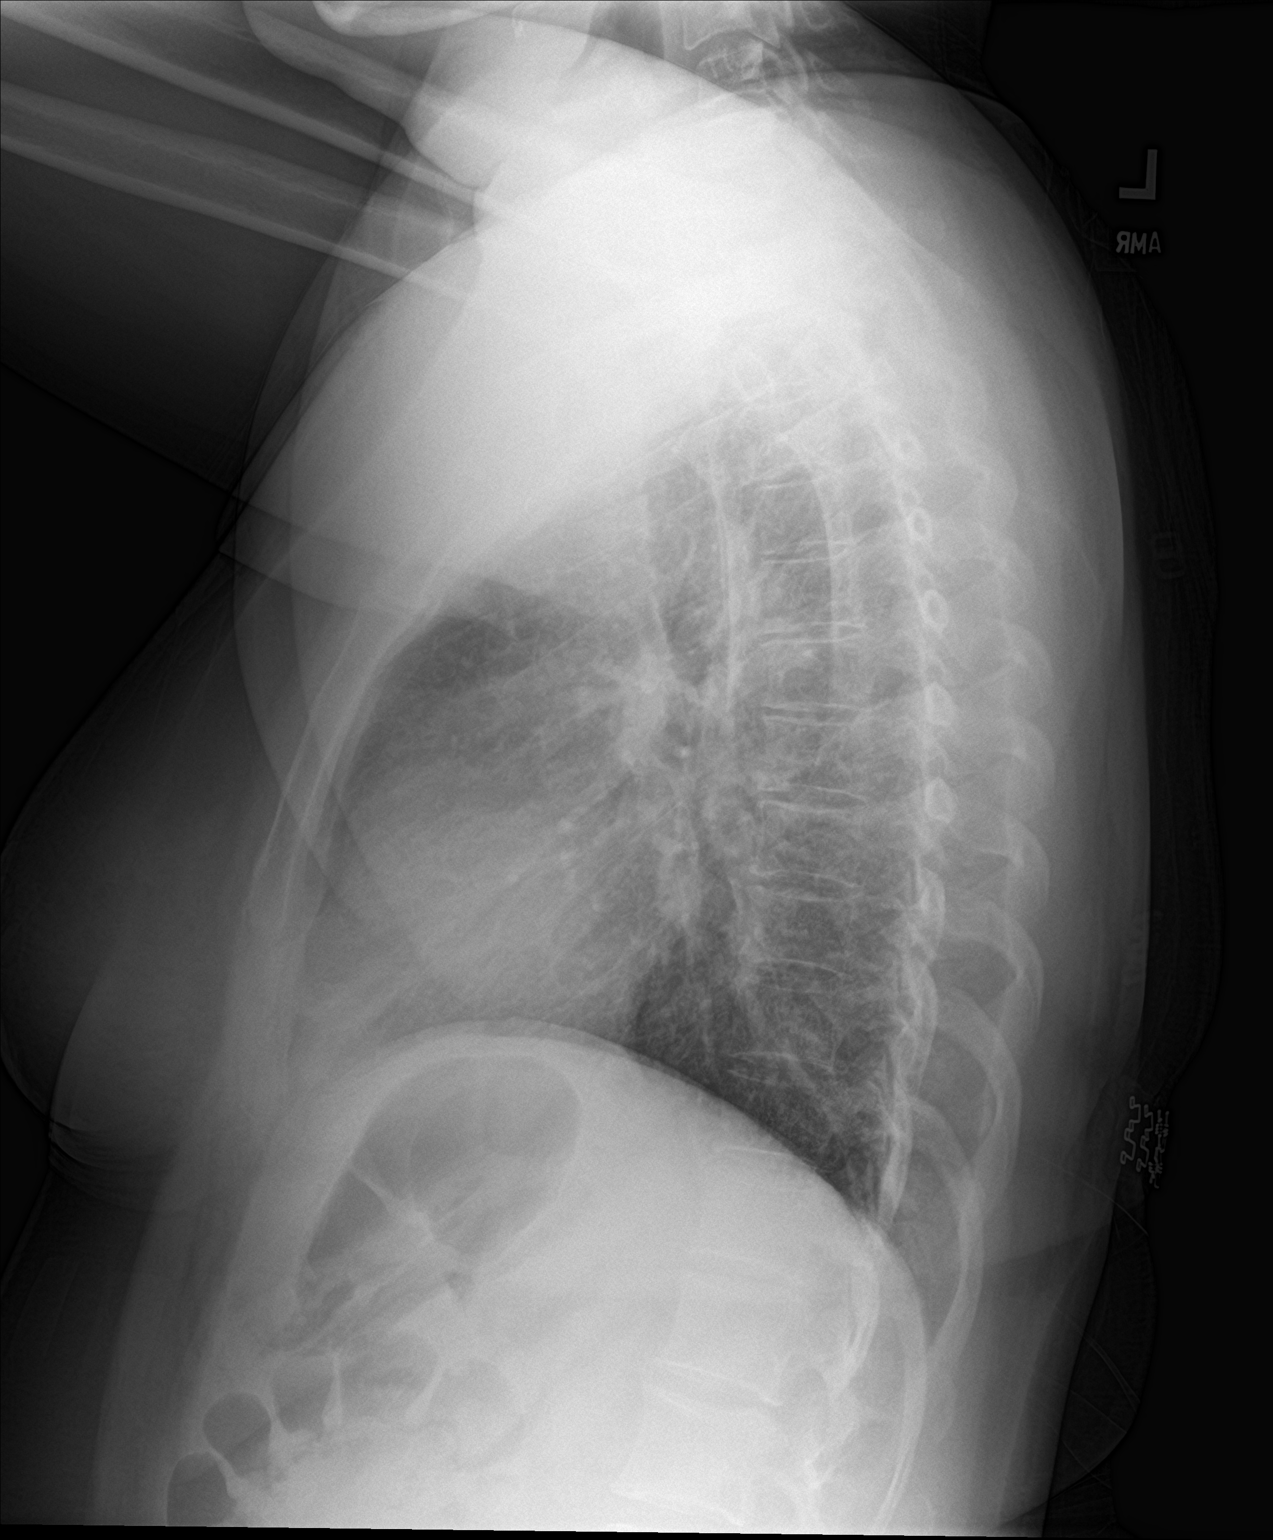

[2 of 2 positions shown; findings below may reference images not displayed]

FINDINGS: The patient is mildly rotated to the right. The cardiac silhouette
is borderline enlarged. The lungs are slightly hypoinflated. No
confluent airspace opacity, edema, pleural effusion, pneumothorax is
identified. No acute osseous abnormality is seen.
IMPRESSION: No active cardiopulmonary disease.

## 2023-04-12 ENCOUNTER — Encounter: Payer: Self-pay | Admitting: Neurology

## 2023-04-12 ENCOUNTER — Ambulatory Visit: Payer: Commercial Managed Care - PPO | Admitting: Neurology

## 2023-06-30 ENCOUNTER — Ambulatory Visit
Admission: EM | Admit: 2023-06-30 | Discharge: 2023-06-30 | Disposition: A | Discharge status: Home / Self Care | Attending: Nurse Practitioner | Admitting: Nurse Practitioner

## 2023-06-30 DIAGNOSIS — R519 Headache, unspecified: Secondary | ICD-10-CM

## 2023-06-30 DIAGNOSIS — B349 Viral infection, unspecified: Secondary | ICD-10-CM | POA: Diagnosis not present

## 2023-06-30 DIAGNOSIS — R42 Dizziness and giddiness: Secondary | ICD-10-CM

## 2023-06-30 LAB — POC COVID19/FLU A&B COMBO
Covid Antigen, POC: NEGATIVE
Influenza A Antigen, POC: NEGATIVE
Influenza B Antigen, POC: NEGATIVE

## 2023-06-30 MED ORDER — ONDANSETRON 4 MG PO TBDP: 4.0000 mg | ORAL_TABLET | Freq: Three times a day (TID) | ORAL | 0 refills | Status: DC | PRN

## 2023-06-30 NOTE — ED Triage Notes (Signed)
 Pt reports she has n/v, dizziness, headache, body aches, and abdominal pain x 1 day

## 2023-06-30 NOTE — ED Provider Notes (Signed)
 [facility]    CSN: [id number] Arrival date & time: 06/30/23  1855      History   Chief Complaint Chief Complaint  Patient presents with   Generalized Body Aches    HPI Brandi Wilson is a 38 y.o. female.   The history is provided by the patient.   Patient presents with a 1 day history of nausea, vomiting, dizziness, headache, body aches, and abdominal pain.  Patient denies fever, chills, nasal congestion, runny nose, cough, diarrhea, or rash.  Patient reports she has taken a pill called "Alva" for her symptoms.  She denies any obvious known sick contacts.  Patient reports that she continues to feel nauseated.  She denies vomiting at this time.  States that she has been increasing her fluid intake.  Past Medical History:  Diagnosis Date   Thyroid disease     Patient Active Problem List   Diagnosis Date Noted   Obesity 05/26/2015   Cephalalgia 05/22/2015   Elevated liver enzymes 05/22/2015    History reviewed. No pertinent surgical history.  OB History     Gravida  2   Para  2   Term  2   Preterm      AB      Living  2      SAB      IAB      Ectopic      Multiple      Live Births               Home Medications    Prior to Admission medications   Medication Sig Start Date End Date Taking? Authorizing Provider  ondansetron (ZOFRAN-ODT) 4 MG disintegrating tablet Take 1 tablet (4 mg total) by mouth every 8 (eight) hours as needed. 06/30/23  Yes Leath-Warren, Sadie Haber, NP  acyclovir (ZOVIRAX) 400 MG tablet Take 1 tablet by mouth twice daily  Tome una tableta por boca dos veces diarias Patient not taking: Reported on 06/10/2020 07/20/19   Jacquelin Hawking, PA-C  BIOTIN PO Take 1 tablet by mouth daily. Patient not taking: Reported on 06/10/2020    [provider]  cetirizine (ZYRTEC) 10 MG tablet Take 1 tablet (10 mg total) by mouth daily. 12/31/21   Leath-Warren, Sadie Haber, NP  gabapentin (NEURONTIN) 300 MG  capsule Take 1 capsule (300 mg total) by mouth 3 (three) times daily. 06/10/20   Lazaro Arms, MD  hydrocortisone (ANUSOL-HC) 25 MG suppository Place 1 suppository (25 mg total) rectally 2 (two) times daily. 11/04/21   Leath-Warren, Sadie Haber, NP  hydrOXYzine (ATARAX/VISTARIL) 25 MG tablet Take 1 tablet (25 mg total) by mouth every 6 (six) hours. 11/13/20   Kommor, Madison, MD  levothyroxine (SYNTHROID) 25 MCG tablet Take 1 tablet (25 mcg total) by mouth daily before breakfast. Tome una tableta por boca diaria 03/24/20   Jacquelin Hawking, PA-C  medroxyPROGESTERone (DEPO-PROVERA) 150 MG/ML injection Inject 150 mg into the muscle every 3 (three) months.    [provider]  Omega-3 Fatty Acids (FISH OIL PO) Take 1 capsule by mouth daily. Patient not taking: Reported on 06/10/2020    [provider]  ondansetron (ZOFRAN) 4 MG tablet Take 1 tablet (4 mg total) by mouth every 8 (eight) hours as needed for nausea or vomiting. 04/17/22   Leath-Warren, Sadie Haber, NP  propranolol (INDERAL) 10 MG tablet 1 po bid for headache prevention.  Tome una tableta por [employer] veces diarias para prevencion de dolor de [place]  03/24/20   Jacquelin Hawking, PA-C    Family History Family History  Problem Relation Age of Onset   Cancer Mother     Social History Social History   Tobacco Use   Smoking status: Never   Smokeless tobacco: Never  Vaping Use   Vaping status: Never Used  Substance Use Topics   Alcohol use: No   Drug use: Never     Allergies   Patient has no known allergies.   Review of Systems Review of Systems Per HPI  Physical Exam Triage Vital Signs ED Triage Vitals  Encounter Vitals Group     BP 06/30/23 1915 123/82     Systolic BP Percentile --      Diastolic BP Percentile --      Pulse Rate 06/30/23 1915 81     Resp 06/30/23 1915 16     Temp 06/30/23 1915 98.6 F (37 C)     Temp Source 06/30/23 1915 Oral     SpO2 06/30/23 1915 98 %     Weight --      Height --       Head Circumference --      Peak Flow --      Pain Score 06/30/23 1916 8     Pain Loc --      Pain Education --      Exclude from Growth Chart --    No data found.  Updated Vital Signs BP 123/82   Pulse 81   Temp 98.6 F (37 C) (Oral)   Resp 16   LMP 06/24/2023   SpO2 98%   Visual Acuity Right Eye Distance:   Left Eye Distance:   Bilateral Distance:    Right Eye Near:   Left Eye Near:    Bilateral Near:     Physical Exam Vitals and nursing note reviewed.  Constitutional:      General: She is not in acute distress.    Appearance: Normal appearance.  HENT:     Head: Normocephalic.     Right Ear: Tympanic membrane, ear canal and external ear normal.     Left Ear: Tympanic membrane, ear canal and external ear normal.     Nose: Nose normal.     Mouth/Throat:     Mouth: Mucous membranes are moist.  Eyes:     Extraocular Movements: Extraocular movements intact.     Conjunctiva/sclera: Conjunctivae normal.     Pupils: Pupils are equal, round, and reactive to light.  Cardiovascular:     Rate and Rhythm: Normal rate and regular rhythm.     Pulses: Normal pulses.     Heart sounds: Normal heart sounds.  Pulmonary:     Effort: Pulmonary effort is normal.     Breath sounds: Normal breath sounds.  Abdominal:     General: Bowel sounds are normal.     Palpations: Abdomen is soft.     Tenderness: There is abdominal tenderness in the epigastric area.  Musculoskeletal:     Cervical back: Normal range of motion.  Skin:    General: Skin is warm and dry.  Neurological:     General: No focal deficit present.     Mental Status: She is alert and oriented to person, place, and time.  Psychiatric:        Mood and Affect: Mood normal.        Behavior: Behavior normal.      UC Treatments / Results  Labs (all labs ordered are listed, but  only abnormal results are displayed) Labs Reviewed  POC COVID19/FLU A&B COMBO - Normal    EKG   Radiology No results  found.  Procedures Procedures (including critical care time)  Medications Ordered in UC Medications - No data to display  Initial Impression / Assessment and Plan / UC Course  I have reviewed the triage vital signs and the nursing notes.  Pertinent labs & imaging results that were available during my care of the patient were reviewed by me and considered in my medical decision making (see chart for details).  COVID/flu test is negative.  Given patient's symptoms and current presentation, suspect a viral illness.  Her vital signs are currently stable, she is well-appearing and is in no acute distress.  Will provide patient treatment for nausea with Zofran 4 mg ODT.  Supportive care recommendations were provided and discussed with the patient to include fluids, rest, and over-the-counter analgesics.  Discussed indications regarding follow-up.  Patient was in agreement with this plan of care and verbalizes understanding.  All questions were answered.  Patient stable for discharge.  Work note was provided.  Final Clinical Impressions(s) / UC Diagnoses   Final diagnoses:  Viral illness  Nonintractable headache, unspecified chronicity pattern, unspecified headache type  Dizziness     Discharge Instructions      The COVID/flu test is negative. You may take over-the-counter Tylenol or Ibuprofen as needed for pain, fever, or general discomfort. Recommend a BRAT diet to include bananas, rice, applesauce and toast. Increase fluids and allow for plenty of rest.  Symptoms should improve over the next 5 to 7 days. If symptoms fail to improve or worsen, seek care immediately. Follow-up as needed.      ED Prescriptions     Medication Sig Dispense Auth. Provider   ondansetron (ZOFRAN-ODT) 4 MG disintegrating tablet Take 1 tablet (4 mg total) by mouth every 8 (eight) hours as needed. 20 tablet Leath-Warren, Belen Bowers, NP      PDMP not reviewed this encounter.   Hardy Lia,  NP 06/30/23 2015

## 2023-06-30 NOTE — Discharge Instructions (Signed)
 The COVID/flu test is negative. You may take over-the-counter Tylenol or Ibuprofen as needed for pain, fever, or general discomfort. Recommend a BRAT diet to include bananas, rice, applesauce and toast. Increase fluids and allow for plenty of rest.  Symptoms should improve over the next 5 to 7 days. If symptoms fail to improve or worsen, seek care immediately. Follow-up as needed.

## 2023-07-06 ENCOUNTER — Encounter: Payer: Self-pay | Admitting: Diagnostic Neuroimaging

## 2023-07-06 ENCOUNTER — Ambulatory Visit (INDEPENDENT_AMBULATORY_CARE_PROVIDER_SITE_OTHER): Admitting: Diagnostic Neuroimaging

## 2023-07-06 VITALS — BP 109/78 | HR 79 | Ht 59.0 in | Wt 184.0 lb

## 2023-07-06 DIAGNOSIS — R519 Headache, unspecified: Secondary | ICD-10-CM | POA: Diagnosis not present

## 2023-07-06 MED ORDER — RIZATRIPTAN BENZOATE 10 MG PO TBDP: 10.0000 mg | ORAL_TABLET | ORAL | 11 refills | Status: DC | PRN

## 2023-07-06 MED ORDER — TOPIRAMATE 50 MG PO TABS: 50.0000 mg | ORAL_TABLET | Freq: Two times a day (BID) | ORAL | 6 refills | Status: DC

## 2023-07-06 NOTE — Progress Notes (Signed)
 [facility]  PATIENT: Brandi Wilson DOB: Dec 31, 1985  REFERRING CLINICIAN: [place], O'Laf, PA-C HISTORY FROM: patient  REASON FOR VISIT: new consult   HISTORICAL  CHIEF COMPLAINT:  Chief Complaint  Patient presents with   Headache    Rm 7 with [facility] interpreter Pt is well, reports she has been having migraine headaches for about 6 months. She is currently having about 4 headaches a week. Associated sensitivity to light and sound, dizziness,  blurred vision.     HISTORY OF PRESENT ILLNESS:   38 year old female here for evaluation of headaches.  The past 6 months having increasing right occipital radiating to bilateral frontal severe headaches lasting 30 minutes up to all day.  Headaches occurring twice per week.  Associated with nausea, dizziness, blurred vision, triggered by stress and anxiety.  Similar headaches in mother.  Has tried some migraine medications without relief.  Had some other types of milder headaches about 4 years ago which improved.   REVIEW OF SYSTEMS: Full 14 system review of systems performed and negative with exception of: as per HPI.  ALLERGIES: No Known Allergies  HOME MEDICATIONS: Outpatient Medications Prior to Visit  Medication Sig Dispense Refill   amitriptyline (ELAVIL) 10 MG tablet Take 10 mg by mouth at bedtime.     levothyroxine  (SYNTHROID ) 150 MCG tablet Take 150 mcg by mouth daily.     Vitamin D, Cholecalciferol, 10 MCG (400 UNIT) CAPS Take by mouth.     acyclovir  (ZOVIRAX ) 400 MG tablet Take 1 tablet by mouth twice daily  Tome una tableta por boca dos veces diarias (Patient not taking: Reported on 07/06/2023) 60 tablet 4   BIOTIN PO Take 1 tablet by mouth daily. (Patient not taking: Reported on 07/06/2023)     cetirizine  (ZYRTEC ) 10 MG tablet Take 1 tablet (10 mg total) by mouth daily. (Patient not taking: Reported on 07/06/2023) 30 tablet 0   gabapentin  (NEURONTIN ) 300 MG capsule Take 1 capsule (300 mg  total) by mouth 3 (three) times daily. (Patient not taking: Reported on 07/06/2023) 90 capsule 3   hydrocortisone  (ANUSOL -HC) 25 MG suppository Place 1 suppository (25 mg total) rectally 2 (two) times daily. (Patient not taking: Reported on 07/06/2023) 12 suppository 0   hydrOXYzine  (ATARAX /VISTARIL ) 25 MG tablet Take 1 tablet (25 mg total) by mouth every 6 (six) hours. (Patient not taking: Reported on 07/06/2023) 12 tablet 0   levothyroxine  (SYNTHROID ) 25 MCG tablet Take 1 tablet (25 mcg total) by mouth daily before breakfast. Rogenia Clause tableta por boca diaria (Patient not taking: Reported on 07/06/2023) 30 tablet 0   medroxyPROGESTERone (DEPO-PROVERA) 150 MG/ML injection Inject 150 mg into the muscle every 3 (three) months. (Patient not taking: Reported on 07/06/2023)     Omega-3 Fatty Acids (FISH OIL PO) Take 1 capsule by mouth daily. (Patient not taking: Reported on 07/06/2023)     ondansetron  (ZOFRAN ) 4 MG tablet Take 1 tablet (4 mg total) by mouth every 8 (eight) hours as needed for nausea or vomiting. (Patient not taking: Reported on 07/06/2023) 20 tablet 0   ondansetron  (ZOFRAN -ODT) 4 MG disintegrating tablet Take 1 tablet (4 mg total) by mouth every 8 (eight) hours as needed. (Patient not taking: Reported on 07/06/2023) 20 tablet 0   propranolol  (INDERAL ) 10 MG tablet 1 po bid for headache prevention.  Tome una tableta por boca dos veces diarias para prevencion de dolor de [place] (Patient not taking: Reported on 07/06/2023) 60 tablet 0   No facility-administered medications prior  to visit.    PAST MEDICAL HISTORY: Past Medical History:  Diagnosis Date   Thyroid  disease     PAST SURGICAL HISTORY: History reviewed. No pertinent surgical history.  FAMILY HISTORY: Family History  Problem Relation Age of Onset   Cancer Mother     SOCIAL HISTORY: Social History   Socioeconomic History   Marital status: Married    Spouse name: Not on file   Number of children: Not on file   Years of  education: Not on file   Highest education level: Not on file  Occupational History   Not on file  Tobacco Use   Smoking status: Never   Smokeless tobacco: Never  Vaping Use   Vaping status: Never Used  Substance and Sexual Activity   Alcohol use: No   Drug use: Never   Sexual activity: Yes    Birth control/protection: Injection  Other Topics Concern   Not on file  Social History Narrative   Not on file   Social Drivers of Health   Financial Resource Strain: Not on file  Food Insecurity: Not on file  Transportation Needs: Not on file  Physical Activity: Not on file  Stress: Not on file  Social Connections: Not on file  Intimate Partner Violence: Not on file     PHYSICAL EXAM  GENERAL EXAM/CONSTITUTIONAL: Vitals:  Vitals:   07/06/23 1103  BP: 109/78  Pulse: 79  Weight: 184 lb (83.5 kg)  Height: 4\' 11"  (1.499 m)   Body mass index is 37.16 kg/m. Wt Readings from Last 3 Encounters:  07/06/23 184 lb (83.5 kg)  11/13/20 175 lb 14.8 oz (79.8 kg)  06/10/20 181 lb (82.1 kg)   Patient is in no distress; well developed, nourished and groomed; neck is supple  CARDIOVASCULAR: Examination of carotid arteries is normal; no carotid bruits Regular rate and rhythm, no murmurs Examination of peripheral vascular system by observation and palpation is normal  EYES: Ophthalmoscopic exam of optic discs and posterior segments is normal; no papilledema or hemorrhages No results found.  MUSCULOSKELETAL: Gait, strength, tone, movements noted in Neurologic exam below  NEUROLOGIC: MENTAL STATUS:      No data to display         awake, alert, oriented to person, place and time recent and remote memory intact normal attention and concentration language fluent, comprehension intact, naming intact fund of knowledge appropriate  CRANIAL NERVE:  2nd - no papilledema on fundoscopic exam 2nd, 3rd, 4th, 6th - pupils equal and reactive to light, visual fields full to  confrontation, extraocular muscles intact, no nystagmus 5th - facial sensation symmetric 7th - facial strength symmetric 8th - hearing intact 9th - palate elevates symmetrically, uvula midline 11th - shoulder shrug symmetric 12th - tongue protrusion midline  MOTOR:  normal bulk and tone, full strength in the BUE, BLE  SENSORY:  normal and symmetric to light touch, temperature, vibration  COORDINATION:  finger-nose-finger, fine finger movements normal  REFLEXES:  deep tendon reflexes present and symmetric  GAIT/STATION:  narrow based gait     DIAGNOSTIC DATA (LABS, IMAGING, TESTING) - I reviewed patient records, labs, notes, testing and imaging myself where available.  Lab Results  Component Value Date   WBC 8.0 11/13/2020   HGB 14.1 11/13/2020   HCT 43.1 11/13/2020   MCV 94.1 11/13/2020   PLT 294 11/13/2020      Component Value Date/Time   NA 136 11/13/2020 1510   K 3.8 11/13/2020 1510   CL 105 11/13/2020 1510  CO2 24 11/13/2020 1510   GLUCOSE 90 11/13/2020 1510   BUN 8 11/13/2020 1510   CREATININE 0.54 11/13/2020 1510   CREATININE 0.67 03/23/2016 0755   CALCIUM 9.2 11/13/2020 1510   PROT 8.5 (H) 11/13/2020 1510   ALBUMIN 4.5 11/13/2020 1510   AST 21 11/13/2020 1510   ALT 22 11/13/2020 1510   ALKPHOS 118 11/13/2020 1510   BILITOT 0.5 11/13/2020 1510   GFRNONAA >60 11/13/2020 1510   GFRNONAA >89 04/21/2015 1210   GFRAA >60 11/23/2018 1127   GFRAA >89 04/21/2015 1210   Lab Results  Component Value Date   CHOL 233 (H) 03/21/2020   HDL 40 (L) 03/21/2020   LDLCALC 151 (H) 03/21/2020   TRIG 210 (H) 03/21/2020   CHOLHDL 5.8 03/21/2020   Lab Results  Component Value Date   HGBA1C 5.4 03/21/2020   No results found for: "VITAMINB12" Lab Results  Component Value Date   TSH 6.891 (H) 03/21/2020      ASSESSMENT AND PLAN  38 y.o. year old female here with:  Meds tried: amitriptyline, propranolol , ibuprofen, zofran , sumatriptan  Dx:  1. New  onset headache     PLAN:  NEW ONSET HEADACHES (x 6 months; right sided, nausea, blurred vision, dizziness) - check MRI brain (rule out other causes)   MIGRAINE PREVENTION  LIFESTYLE CHANGES -Stop or avoid smoking -Decrease or avoid caffeine / alcohol -Eat and sleep on a regular schedule -Exercise several times per week - start topiramate  50mg  at bedtime; after 1-2 weeks increase to 50mg  twice a day; drink plenty of water  MIGRAINE RESCUE  - ibuprofen, tylenol as needed - rizatriptan  (Maxalt ) 10mg  as needed for breakthrough headache; may repeat x 1 after 2 hours; max 2 tabs per day or 8 per month  Orders Placed This Encounter  Procedures   MR BRAIN W WO CONTRAST   Meds ordered this encounter  Medications   topiramate  (TOPAMAX ) 50 MG tablet    Sig: Take 1 tablet (50 mg total) by mouth 2 (two) times daily.    Dispense:  60 tablet    Refill:  6   rizatriptan  (MAXALT -MLT) 10 MG disintegrating tablet    Sig: Take 1 tablet (10 mg total) by mouth as needed for migraine. May repeat in 2 hours if needed    Dispense:  9 tablet    Refill:  11   Return in about 4 months (around 11/05/2023) for MyChart visit (15 min).    Omega Bible, MD 07/06/2023, 11:44 AM Certified in Neurology, Neurophysiology and Neuroimaging  [facility] [address] [place], [place] [address] [phone]

## 2023-07-06 NOTE — Patient Instructions (Addendum)
 NEW ONSET HEADACHES (x 6 months; right sided, nausea, blurred vision, dizziness) - check MRI brain (rule out other causes)  MIGRAINE TREATMENT PLAN:  MIGRAINE PREVENTION  LIFESTYLE CHANGES -Stop or avoid smoking -Decrease or avoid caffeine / alcohol -Eat and sleep on a regular schedule -Exercise several times per week - start topiramate  50mg  at bedtime; after 1-2 weeks increase to 50mg  twice a day; drink plenty of water  MIGRAINE RESCUE  - ibuprofen, tylenol as needed - rizatriptan  (Maxalt ) 10mg  as needed for breakthrough headache; may repeat x 1 after 2 hours; max 2 tabs per day or 8 per month

## 2023-07-12 ENCOUNTER — Telehealth: Payer: Self-pay | Admitting: Diagnostic Neuroimaging

## 2023-07-12 NOTE — Telephone Encounter (Signed)
 [employer] Siegfried Dress: [id number] exp. 07/12/23-09/11/23 scheduled at GI

## 2023-07-26 ENCOUNTER — Ambulatory Visit
Admission: RE | Admit: 2023-07-26 | Discharge: 2023-07-26 | Disposition: A | Discharge status: Home / Self Care | Source: Ambulatory Visit | Attending: Diagnostic Neuroimaging | Admitting: Diagnostic Neuroimaging

## 2023-07-26 ENCOUNTER — Ambulatory Visit: Payer: Self-pay | Admitting: Diagnostic Neuroimaging

## 2023-07-26 DIAGNOSIS — R519 Headache, unspecified: Secondary | ICD-10-CM

## 2023-07-26 MED ORDER — GADOPICLENOL 0.5 MMOL/ML IV SOLN
10.0000 mL | Freq: Once | INTRAVENOUS | Status: AC | PRN
  Administered 2023-07-26: 10 mL via INTRAVENOUS

## 2023-07-28 NOTE — Telephone Encounter (Signed)
-----   Message from Omega Bible sent at 07/26/2023  5:17 PM EDT ----- Results are good, no major findings. Continue current plan. -VRP

## 2023-07-28 NOTE — Telephone Encounter (Signed)
 Called the interpretor line to reach and advise the patient of the MRI results. Reviewed the results and advised of the findings. Pt asked what could be causing this since she is still having the pain. She went to the ER again because of this. Advised the patient that sometimes we don't know what could cause the migraines but we are able to rule out by the brain MRI findings that it wasn't anything like tumor or stroke.  Pt states that she is taking topiramate  50 mg but had only been taking it once a day. Advised she should take that twice a day.  When I asked about the rizatriptan  pt states that she has not taken that. I educated on the use of the acute medication and use only as needed for a severe migraine.  Advised if after increasing the topamax  to 50 mg twice a day she still notices her headaches being bad, to let us  know and we can see if we need to make adjustments. The patient was listed as VV 8/26 I have cancelled and made that in office visit because she will need interpreter present and time to review.

## 2023-09-06 ENCOUNTER — Ambulatory Visit: Admitting: Neurology

## 2023-11-01 ENCOUNTER — Encounter: Admitting: Obstetrics & Gynecology

## 2023-11-08 ENCOUNTER — Encounter: Payer: Self-pay | Admitting: Diagnostic Neuroimaging

## 2023-11-08 ENCOUNTER — Ambulatory Visit (INDEPENDENT_AMBULATORY_CARE_PROVIDER_SITE_OTHER): Admitting: Diagnostic Neuroimaging

## 2023-11-08 ENCOUNTER — Telehealth: Admitting: Diagnostic Neuroimaging

## 2023-11-08 VITALS — BP 110/80 | HR 60 | Ht 60.0 in | Wt 175.2 lb

## 2023-11-08 DIAGNOSIS — G43009 Migraine without aura, not intractable, without status migrainosus: Secondary | ICD-10-CM | POA: Diagnosis not present

## 2023-11-08 MED ORDER — RIZATRIPTAN BENZOATE 10 MG PO TBDP: 10.0000 mg | ORAL_TABLET | ORAL | 11 refills | Status: AC | PRN

## 2023-11-08 MED ORDER — TOPIRAMATE 50 MG PO TABS: 50.0000 mg | ORAL_TABLET | Freq: Two times a day (BID) | ORAL | 4 refills | Status: AC

## 2023-11-08 NOTE — Progress Notes (Signed)
 [facility]  PATIENT: Brandi Wilson DOB: March 05, 1986  REFERRING CLINICIAN: No ref. provider found HISTORY FROM: patient (via interpreter) REASON FOR VISIT: follow up   HISTORICAL  CHIEF COMPLAINT:  Chief Complaint  Patient presents with   Follow-up    RM 7, Pt w/interpreter, here for f/u of HA/migraines.     HISTORY OF PRESENT ILLNESS:   UPDATE (11/08/23, VRP): Since last visit, doing well. Symptoms are improving. Severity having 2 HA per month or less. TPX helping a lot. Only needed rizatriptan  for very severe headache, and it helps.   PRIOR HPI (07/06/23): 38 year old female here for evaluation of headaches.  The past 6 months having increasing right occipital radiating to bilateral frontal severe headaches lasting 30 minutes up to all day.  Headaches occurring twice per week.  Associated with nausea, dizziness, blurred vision, triggered by stress and anxiety.  Similar headaches in mother.  Has tried some migraine medications without relief.  Had some other types of milder headaches about 4 years ago which improved.   REVIEW OF SYSTEMS: Full 14 system review of systems performed and negative with exception of: as per HPI.  ALLERGIES: No Known Allergies  HOME MEDICATIONS: Outpatient Medications Prior to Visit  Medication Sig Dispense Refill   Cholecalciferol 125 MCG (5000 UT) TABS Take 1 tablet by mouth daily.     CONSTULOSE 10 GM/15ML solution Take 10 g by mouth daily as needed for moderate constipation.     cyclobenzaprine (FLEXERIL) 10 MG tablet Take 10 mg by mouth 3 (three) times daily.     DULoxetine (CYMBALTA) 60 MG capsule Take 60 mg by mouth daily.     famotidine (PEPCID) 40 MG tablet Take 40 mg by mouth daily.     levothyroxine  (SYNTHROID ) 150 MCG tablet Take 150 mcg by mouth daily.     amitriptyline (ELAVIL) 10 MG tablet Take 10 mg by mouth at bedtime.     fenofibrate (TRICOR) 145 MG tablet Take 145 mg by mouth daily. for  cholesterol.     levothyroxine  (SYNTHROID ) 150 MCG tablet Take 150 mcg by mouth daily.     Vitamin D, Cholecalciferol, 10 MCG (400 UNIT) CAPS Take by mouth.     rizatriptan  (MAXALT -MLT) 10 MG disintegrating tablet Take 1 tablet (10 mg total) by mouth as needed for migraine. May repeat in 2 hours if needed 9 tablet 11   topiramate  (TOPAMAX ) 50 MG tablet Take 1 tablet (50 mg total) by mouth 2 (two) times daily. 60 tablet 6   No facility-administered medications prior to visit.    PAST MEDICAL HISTORY: Past Medical History:  Diagnosis Date   Thyroid  disease     PAST SURGICAL HISTORY: History reviewed. No pertinent surgical history.  FAMILY HISTORY: Family History  Problem Relation Age of Onset   Cancer Mother     SOCIAL HISTORY: Social History   Socioeconomic History   Marital status: Married    Spouse name: Not on file   Number of children: Not on file   Years of education: Not on file   Highest education level: Not on file  Occupational History   Not on file  Tobacco Use   Smoking status: Never   Smokeless tobacco: Never  Vaping Use   Vaping status: Never Used  Substance and Sexual Activity   Alcohol use: No   Drug use: Never   Sexual activity: Yes    Birth control/protection: Injection  Other Topics Concern   Not on file  Social  History Narrative   Not on file   Social Drivers of Health   Financial Resource Strain: Not on file  Food Insecurity: Not on file  Transportation Needs: Not on file  Physical Activity: Not on file  Stress: Not on file  Social Connections: Not on file  Intimate Partner Violence: Not on file     PHYSICAL EXAM  GENERAL EXAM/CONSTITUTIONAL: Vitals:  Vitals:   11/08/23 1003  BP: 110/80  Pulse: 60  Weight: 175 lb 3.2 oz (79.5 kg)  Height: 5' (1.524 m)    Body mass index is 34.22 kg/m. Wt Readings from Last 3 Encounters:  11/08/23 175 lb 3.2 oz (79.5 kg)  07/06/23 184 lb (83.5 kg)  11/13/20 175 lb 14.8 oz (79.8 kg)    Patient is in no distress; well developed, nourished and groomed; neck is supple  CARDIOVASCULAR: Examination of carotid arteries is normal; no carotid bruits Regular rate and rhythm, no murmurs Examination of peripheral vascular system by observation and palpation is normal  EYES: Ophthalmoscopic exam of optic discs and posterior segments is normal; no papilledema or hemorrhages No results found.  MUSCULOSKELETAL: Gait, strength, tone, movements noted in Neurologic exam below  NEUROLOGIC: MENTAL STATUS:      No data to display         awake, alert, oriented to person, place and time recent and remote memory intact normal attention and concentration language fluent, comprehension intact, naming intact fund of knowledge appropriate  CRANIAL NERVE:  2nd - no papilledema on fundoscopic exam 2nd, 3rd, 4th, 6th - pupils equal and reactive to light, visual fields full to confrontation, extraocular muscles intact, no nystagmus 5th - facial sensation symmetric 7th - facial strength symmetric 8th - hearing intact 9th - palate elevates symmetrically, uvula midline 11th - shoulder shrug symmetric 12th - tongue protrusion midline  MOTOR:  normal bulk and tone, full strength in the BUE, BLE  SENSORY:  normal and symmetric to light touch, temperature, vibration  COORDINATION:  finger-nose-finger, fine finger movements normal  REFLEXES:  deep tendon reflexes present and symmetric  GAIT/STATION:  narrow based gait     DIAGNOSTIC DATA (LABS, IMAGING, TESTING) - I reviewed patient records, labs, notes, testing and imaging myself where available.  Lab Results  Component Value Date   WBC 8.0 11/13/2020   HGB 14.1 11/13/2020   HCT 43.1 11/13/2020   MCV 94.1 11/13/2020   PLT 294 11/13/2020      Component Value Date/Time   NA 136 11/13/2020 1510   K 3.8 11/13/2020 1510   CL 105 11/13/2020 1510   CO2 24 11/13/2020 1510   GLUCOSE 90 11/13/2020 1510   BUN 8  11/13/2020 1510   CREATININE 0.54 11/13/2020 1510   CREATININE 0.67 03/23/2016 0755   CALCIUM 9.2 11/13/2020 1510   PROT 8.5 (H) 11/13/2020 1510   ALBUMIN 4.5 11/13/2020 1510   AST 21 11/13/2020 1510   ALT 22 11/13/2020 1510   ALKPHOS 118 11/13/2020 1510   BILITOT 0.5 11/13/2020 1510   GFRNONAA >60 11/13/2020 1510   GFRNONAA >89 04/21/2015 1210   GFRAA >60 11/23/2018 1127   GFRAA >89 04/21/2015 1210   Lab Results  Component Value Date   CHOL 233 (H) 03/21/2020   HDL 40 (L) 03/21/2020   LDLCALC 151 (H) 03/21/2020   TRIG 210 (H) 03/21/2020   CHOLHDL 5.8 03/21/2020   Lab Results  Component Value Date   HGBA1C 5.4 03/21/2020   No results found for: [id number] Lab Results  Component  Value Date   TSH 6.891 (H) 03/21/2020   07/26/23 MRI brain 1. No evidence of an acute intracranial abnormality. 2. Few punctate nonspecific chronic insults within the anterior frontal lobe white matter bilaterally. 3. 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 4. Otherwise unremarkable MRI appearance the brain. 5. Paranasal sinus mucosal thickening, as described. 6. Trace right mastoid effusion.   ASSESSMENT AND PLAN  38 y.o. year old female here with:  Meds tried: amitriptyline, propranolol , ibuprofen, zofran , sumatriptan  Dx:  1. Migraine without aura and without status migrainosus, not intractable     PLAN:  MIGRAINE WITHOUT AURA  MIGRAINE PREVENTION  LIFESTYLE CHANGES -Stop or avoid smoking -Decrease or avoid caffeine / alcohol -Eat and sleep on a regular schedule -Exercise several times per week - continue topiramate  50mg  twice a day   MIGRAINE RESCUE  - ibuprofen, tylenol as needed - continue rizatriptan  (Maxalt ) 10mg  as needed for breakthrough headache; may repeat x 1 after 2 hours; max 2 tabs per day or 8 per month  Meds ordered this encounter  Medications   topiramate  (TOPAMAX ) 50 MG  tablet    Sig: Take 1 tablet (50 mg total) by mouth 2 (two) times daily.    Dispense:  180 tablet    Refill:  4   rizatriptan  (MAXALT -MLT) 10 MG disintegrating tablet    Sig: Take 1 tablet (10 mg total) by mouth as needed for migraine. May repeat in 2 hours if needed    Dispense:  9 tablet    Refill:  11   Return in about 1 year (around 11/07/2024) for with NP.    EDUARD FABIENE HANLON, MD 11/08/2023, 10:16 AM Certified in Neurology, Neurophysiology and Neuroimaging  [facility] [address] [place], [place] [address] [phone]

## 2024-11-07 ENCOUNTER — Ambulatory Visit: Admitting: Adult Health
